# Patient Record
Sex: Male | Born: 1995 | Race: Black or African American | Hispanic: No | Marital: Single | Smoking: Current every day smoker
Health system: Southern US, Community
[De-identification: ages and names within clinical notes are randomized; demographics above are authoritative.]

## PROBLEM LIST (undated history)

## (undated) DIAGNOSIS — F419 Anxiety disorder, unspecified: Secondary | ICD-10-CM

## (undated) DIAGNOSIS — J45909 Unspecified asthma, uncomplicated: Secondary | ICD-10-CM

---
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
## 2017-08-12 ENCOUNTER — Emergency Department: Payer: Worker's Compensation

## 2017-08-12 ENCOUNTER — Encounter: Payer: Self-pay | Admitting: Emergency Medicine

## 2017-08-12 ENCOUNTER — Other Ambulatory Visit: Payer: Self-pay

## 2017-08-12 ENCOUNTER — Emergency Department
Admission: EM | Admit: 2017-08-12 | Discharge: 2017-08-12 | Disposition: A | Discharge status: Home / Self Care | Payer: Worker's Compensation | Attending: Emergency Medicine | Admitting: Emergency Medicine

## 2017-08-12 DIAGNOSIS — Z79899 Other long term (current) drug therapy: Secondary | ICD-10-CM | POA: Diagnosis not present

## 2017-08-12 DIAGNOSIS — Y999 Unspecified external cause status: Secondary | ICD-10-CM | POA: Insufficient documentation

## 2017-08-12 DIAGNOSIS — W228XXA Striking against or struck by other objects, initial encounter: Secondary | ICD-10-CM | POA: Diagnosis not present

## 2017-08-12 DIAGNOSIS — S1093XA Contusion of unspecified part of neck, initial encounter: Secondary | ICD-10-CM | POA: Diagnosis not present

## 2017-08-12 DIAGNOSIS — Y939 Activity, unspecified: Secondary | ICD-10-CM | POA: Diagnosis not present

## 2017-08-12 DIAGNOSIS — F17200 Nicotine dependence, unspecified, uncomplicated: Secondary | ICD-10-CM | POA: Diagnosis not present

## 2017-08-12 DIAGNOSIS — Y929 Unspecified place or not applicable: Secondary | ICD-10-CM | POA: Diagnosis not present

## 2017-08-12 DIAGNOSIS — S199XXA Unspecified injury of neck, initial encounter: Secondary | ICD-10-CM | POA: Diagnosis present

## 2017-08-12 HISTORY — DX: Anxiety disorder, unspecified: F41.9

## 2017-08-12 LAB — CBC WITH DIFFERENTIAL/PLATELET
BASOS ABS: 0 10*3/uL (ref 0–0.1)
BASOS PCT: 1 %
EOS ABS: 0 10*3/uL (ref 0–0.7)
EOS PCT: 1 %
HEMATOCRIT: 45.7 % (ref 40.0–52.0)
Hemoglobin: 15.6 g/dL (ref 13.0–18.0)
Lymphocytes Relative: 20 %
Lymphs Abs: 0.9 10*3/uL — ABNORMAL LOW (ref 1.0–3.6)
MCH: 30.1 pg (ref 26.0–34.0)
MCHC: 34.1 g/dL (ref 32.0–36.0)
MCV: 88.2 fL (ref 80.0–100.0)
Monocytes Absolute: 0.5 10*3/uL (ref 0.2–1.0)
Monocytes Relative: 11 %
NEUTROS ABS: 3.2 10*3/uL (ref 1.4–6.5)
Neutrophils Relative %: 67 %
PLATELETS: 177 10*3/uL (ref 150–440)
RBC: 5.18 MIL/uL (ref 4.40–5.90)
RDW: 13.4 % (ref 11.5–14.5)
WBC: 4.8 10*3/uL (ref 3.8–10.6)

## 2017-08-12 LAB — COMPREHENSIVE METABOLIC PANEL
ALT: 35 U/L (ref 17–63)
ANION GAP: 7 (ref 5–15)
AST: 36 U/L (ref 15–41)
Albumin: 4.4 g/dL (ref 3.5–5.0)
Alkaline Phosphatase: 68 U/L (ref 38–126)
BUN: 12 mg/dL (ref 6–20)
CALCIUM: 9.6 mg/dL (ref 8.9–10.3)
CO2: 26 mmol/L (ref 22–32)
Chloride: 103 mmol/L (ref 101–111)
Creatinine, Ser: 1.12 mg/dL (ref 0.61–1.24)
GFR calc Af Amer: >60 mL/min (ref >60)
GFR calc non Af Amer: >60 mL/min (ref >60)
Glucose, Bld: 100 mg/dL — ABNORMAL HIGH (ref 65–99)
Potassium: 3.5 mmol/L (ref 3.5–5.1)
SODIUM: 136 mmol/L (ref 135–145)
Total Bilirubin: 0.5 mg/dL (ref 0.3–1.2)
Total Protein: 7.6 g/dL (ref 6.5–8.1)

## 2017-08-12 MED ORDER — SODIUM CHLORIDE 0.9 % IV BOLUS
1000.0000 mL | Freq: Once | INTRAVENOUS | Status: AC
  Administered 2017-08-12: 1000 mL via INTRAVENOUS

## 2017-08-12 MED ORDER — IOPAMIDOL (ISOVUE-370) INJECTION 76%
75.0000 mL | Freq: Once | INTRAVENOUS | Status: AC | PRN
  Administered 2017-08-12: 75 mL via INTRAVENOUS
  Filled 2017-08-12: qty 75

## 2017-08-12 MED ORDER — MELOXICAM 15 MG PO TABS: 15.0000 mg | ORAL_TABLET | Freq: Every day | ORAL | 0 refills | Status: DC

## 2017-08-12 NOTE — ED Notes (Signed)
Patient transported to CT 

## 2017-08-12 NOTE — ED Triage Notes (Signed)
Pt ambulatory to triage with no difficulty. Pt reports yesterday at work a metal pole fell hitting him on the right side of his neck near his collar bone region. Pt reports reports area was dressed with steri strips and gauze at work. Now feeling at times like he is having difficulty breathing. Pt talling in full and complete sentences with no difficulty at this time.
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
## 2017-08-12 NOTE — ED Notes (Signed)
WC breath analysis completed.

## 2017-08-12 NOTE — ED Notes (Signed)
Completed WC UDS. Pt gone to xray. Will complete breath analysis in pt room.

## 2017-08-30 ENCOUNTER — Emergency Department
Admission: EM | Admit: 2017-08-30 | Discharge: 2017-08-30 | Disposition: A | Discharge status: Home / Self Care | Payer: Self-pay | Attending: Emergency Medicine | Admitting: Emergency Medicine

## 2017-08-30 ENCOUNTER — Encounter: Payer: Self-pay | Admitting: Emergency Medicine

## 2017-08-30 DIAGNOSIS — W228XXD Striking against or struck by other objects, subsequent encounter: Secondary | ICD-10-CM | POA: Insufficient documentation

## 2017-08-30 DIAGNOSIS — S1093XD Contusion of unspecified part of neck, subsequent encounter: Secondary | ICD-10-CM | POA: Insufficient documentation

## 2017-08-30 DIAGNOSIS — F419 Anxiety disorder, unspecified: Secondary | ICD-10-CM | POA: Insufficient documentation

## 2017-08-30 DIAGNOSIS — F17200 Nicotine dependence, unspecified, uncomplicated: Secondary | ICD-10-CM | POA: Insufficient documentation

## 2017-08-30 DIAGNOSIS — J45909 Unspecified asthma, uncomplicated: Secondary | ICD-10-CM | POA: Insufficient documentation

## 2017-08-30 HISTORY — DX: Unspecified asthma, uncomplicated: J45.909

## 2017-08-30 MED ORDER — MELOXICAM 15 MG PO TABS: 15.0000 mg | ORAL_TABLET | Freq: Every day | ORAL | 0 refills | Status: DC
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
## 2018-06-05 ENCOUNTER — Other Ambulatory Visit: Payer: Self-pay

## 2018-06-05 ENCOUNTER — Emergency Department
Admission: EM | Admit: 2018-06-05 | Discharge: 2018-06-05 | Disposition: A | Discharge status: Home / Self Care | Payer: Self-pay | Attending: Emergency Medicine | Admitting: Emergency Medicine

## 2018-06-05 ENCOUNTER — Encounter: Payer: Self-pay | Admitting: Emergency Medicine

## 2018-06-05 DIAGNOSIS — J069 Acute upper respiratory infection, unspecified: Secondary | ICD-10-CM | POA: Insufficient documentation

## 2018-06-05 DIAGNOSIS — J45909 Unspecified asthma, uncomplicated: Secondary | ICD-10-CM | POA: Insufficient documentation

## 2018-06-05 DIAGNOSIS — F17200 Nicotine dependence, unspecified, uncomplicated: Secondary | ICD-10-CM | POA: Insufficient documentation

## 2018-06-05 MED ORDER — PSEUDOEPH-BROMPHEN-DM 30-2-10 MG/5ML PO SYRP: 5.0000 mL | ORAL_SOLUTION | Freq: Four times a day (QID) | ORAL | 0 refills | Status: DC | PRN
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
## 2018-06-05 NOTE — Discharge Instructions (Addendum)
Begin taking Bromfed-DM as needed for cough and congestion.  Increase fluids.  Take Tylenol or ibuprofen as needed for sore throat.   Follow-up with your primary care provider if any continued problems or [facility].

## 2018-06-05 NOTE — ED Provider Notes (Signed)
[facility] Emergency Department Provider Note  ____________________________________________   First MD Initiated Contact with Patient 06/05/18 1233     (approximate)  I have reviewed the triage vital signs and the nursing notes.   HISTORY  Chief Complaint URI   HPI Kevin Bullock is a 23 y.o. male presents to the ED with complaint of upper respiratory symptoms.  Patient states that he and his girlfriend slept with the window open and now he has nasal congestion, sore throat and some coughing.  He states that this started 3 days ago.  He denies any fever.  He has not taken any over-the-counter medication.     Past Medical History:  Diagnosis Date  . Anxiety   . Asthma     There are no active problems to display for this patient.   History reviewed. No pertinent surgical history.  Prior to Admission medications   Medication Sig Start Date End Date Taking? Authorizing Provider  brompheniramine-pseudoephedrine-DM 30-2-10 MG/5ML syrup Take 5 mLs by mouth 4 (four) times daily as needed. 06/05/18   Tommi Rumps, PA-C    Allergies Patient has no known allergies.  History reviewed. No pertinent family history.  Social History Social History   Tobacco Use  . Smoking status: Current Every Day Smoker    Packs/day: 0.50  . Smokeless tobacco: Never Used  Substance Use Topics  . Alcohol use: No  . Drug use: Never    Review of Systems Constitutional: No fever/chills Eyes: No visual changes. ENT: Positive sore throat.  Positive nasal congestion. Cardiovascular: Denies chest pain. Respiratory: Denies shortness of breath.  Positive cough. Gastrointestinal: No abdominal pain.  No nausea, no vomiting. Musculoskeletal: Negative for muscle aches. Skin: Negative for rash. Neurological: Negative for headaches, focal weakness or numbness. ___________________________________________   PHYSICAL EXAM:  VITAL SIGNS: ED Triage Vitals  Enc Vitals  Group     BP 06/05/18 1130 132/65     Pulse Rate 06/05/18 1130 97     Resp 06/05/18 1130 16     Temp 06/05/18 1130 98.4 F (36.9 C)     Temp Source 06/05/18 1130 Oral     SpO2 06/05/18 1130 96 %     Weight 06/05/18 1129 170 lb (77.1 kg)     Height 06/05/18 1129 5\' 8"  (1.727 m)     Head Circumference --      Peak Flow --      Pain Score 06/05/18 1129 0     Pain Loc --      Pain Edu? --      Excl. in GC? --    Constitutional: Alert and oriented. Well appearing and in no acute distress. Eyes: Conjunctivae are normal.  Head: Atraumatic. Nose: Positive congestion/rhinnorhea.  TMs are dull bilaterally. Mouth/Throat: Mucous membranes are moist.  Oropharynx non-erythematous. Neck: No stridor.   Hematological/Lymphatic/Immunilogical: No cervical lymphadenopathy. Cardiovascular: Normal rate, regular rhythm. Grossly normal heart sounds.  Good peripheral circulation. Respiratory: Normal respiratory effort.  No retractions. Lungs CTAB. Musculoskeletal: Moves upper and lower extremities without any difficulty.  Normal gait was noted. Neurologic:  Normal speech and language. No gross focal neurologic deficits are appreciated. No gait instability. Skin:  Skin is warm, dry and intact. No rash noted. Psychiatric: Mood and affect are normal. Speech and behavior are normal.  ____________________________________________   LABS (all labs ordered are listed, but only abnormal results are displayed)  Labs Reviewed - No data to display ____________________________________________  PROCEDURES  Procedure(s) performed (including Critical Care):  Procedures   ____________________________________________   INITIAL IMPRESSION / ASSESSMENT AND PLAN / ED COURSE  As part of my medical decision making, I reviewed the following data within the electronic MEDICAL RECORD NUMBER Notes from prior ED visits and [place] Controlled Substance Database  Patient presents to the ED with complaint of 3 days URI symptoms  after he slept with the window open.  He states that he has not taken any over-the-counter medication for his symptoms of congestion and sore throat.  He denies any fever or chills.  There is been no nausea or vomiting.  He requested a note to take the entire week off.  It was explained to him that the hospital policy is for a note for 2 days.  Patient was given a prescription for Bromfed-DM as needed for his symptoms. ____________________________________________   FINAL CLINICAL IMPRESSION(S) / ED DIAGNOSES  Final diagnoses:  Viral upper respiratory tract infection     ED Discharge Orders         Ordered    brompheniramine-pseudoephedrine-DM 30-2-10 MG/5ML syrup  4 times daily PRN     06/05/18 1336           Note:  This document was prepared using Dragon voice recognition software and may include unintentional dictation errors.    Tommi Rumps, PA-C 06/05/18 1423    Jene Every, MD 06/05/18 [phone]

## 2018-09-11 ENCOUNTER — Other Ambulatory Visit: Payer: Self-pay

## 2018-09-11 ENCOUNTER — Encounter: Payer: Self-pay | Admitting: *Deleted

## 2018-09-11 ENCOUNTER — Emergency Department
Admission: EM | Admit: 2018-09-11 | Discharge: 2018-09-11 | Disposition: A | Discharge status: Home / Self Care | Payer: Self-pay | Attending: Emergency Medicine | Admitting: Emergency Medicine

## 2018-09-11 DIAGNOSIS — F17200 Nicotine dependence, unspecified, uncomplicated: Secondary | ICD-10-CM | POA: Insufficient documentation

## 2018-09-11 DIAGNOSIS — J029 Acute pharyngitis, unspecified: Secondary | ICD-10-CM | POA: Insufficient documentation

## 2018-09-11 DIAGNOSIS — H1089 Other conjunctivitis: Secondary | ICD-10-CM | POA: Insufficient documentation

## 2018-09-11 DIAGNOSIS — H1032 Unspecified acute conjunctivitis, left eye: Secondary | ICD-10-CM

## 2018-09-11 DIAGNOSIS — J45909 Unspecified asthma, uncomplicated: Secondary | ICD-10-CM | POA: Insufficient documentation

## 2018-09-11 LAB — GROUP A STREP BY PCR: Group A Strep by PCR: NOT DETECTED

## 2018-09-11 MED ORDER — ERYTHROMYCIN 5 MG/GM OP OINT: 1.0000 "application " | TOPICAL_OINTMENT | Freq: Every day | OPHTHALMIC | 0 refills | Status: AC

## 2018-09-11 NOTE — ED Triage Notes (Signed)
Pt has a sore throat and left eye drainage.  No known injury to eye.  Pt alert.  Sx for 3 days

## 2018-09-11 NOTE — ED Provider Notes (Signed)
[facility] Emergency Department Provider Note  ____________________________________________  Time seen: Approximately 9:12 PM  I have reviewed the triage vital signs and the nursing notes.   HISTORY  Chief Complaint Sore Throat and Conjunctivitis    HPI Kevin Bullock is a 23 y.o. male presents to the emergency department with pharyngitis and left eye conjunctivitis for the past 3 to 4 days.  Patient denies headache, rhinorrhea, nasal congestion or nonproductive cough.  Patient denies tactile fever at home.  He has had no difficulty swallowing and is tolerating his own secretions in exam room.  Denies unprotected sex. No other alleviating measures have been attempted.         Past Medical History:  Diagnosis Date  . Anxiety   . Asthma     There are no active problems to display for this patient.   No past surgical history on file.  Prior to Admission medications   Medication Sig Start Date End Date Taking? Authorizing Provider  brompheniramine-pseudoephedrine-DM 30-2-10 MG/5ML syrup Take 5 mLs by mouth 4 (four) times daily as needed. 06/05/18   Tommi RumpsSummers, Rhonda L, PA-C  erythromycin ophthalmic ointment Place 1 application into the left eye at bedtime for 7 days. 09/11/18 09/18/18  Orvil FeilWoods, Oveda Dadamo M, PA-C    Allergies Patient has no known allergies.  No family history on file.  Social History Social History   Tobacco Use  . Smoking status: Current Every Day Smoker    Packs/day: 0.50  . Smokeless tobacco: Never Used  Substance Use Topics  . Alcohol use: No  . Drug use: Never     Review of Systems  Constitutional: No fever/chills Eyes: No visual changes. Patient has left eye conjunctivitis.  ENT: Patient has pharyngitis.  Cardiovascular: no chest pain. Respiratory: no cough. No SOB. Gastrointestinal: No abdominal pain.  No nausea, no vomiting.  No diarrhea.  No constipation. Genitourinary: Negative for dysuria. No  hematuria Musculoskeletal: Negative for musculoskeletal pain. Skin: Negative for rash, abrasions, lacerations, ecchymosis. Neurological: Negative for headaches, focal weakness or numbness.  ____________________________________________   PHYSICAL EXAM:  VITAL SIGNS: ED Triage Vitals  Enc Vitals Group     BP 09/11/18 1805 127/75     Pulse Rate 09/11/18 1805 80     Resp 09/11/18 1805 (!) 180     Temp 09/11/18 1805 98.8 F (37.1 C)     Temp Source 09/11/18 1805 Oral     SpO2 09/11/18 1805 99 %     Weight 09/11/18 1804 180 lb (81.6 kg)     Height 09/11/18 1804 5\' 8"  (1.727 m)     Head Circumference --      Peak Flow --      Pain Score 09/11/18 1803 3     Pain Loc --      Pain Edu? --      Excl. in [place]? --      Constitutional: Alert and oriented. Well appearing and in no acute distress. Eyes: Patient has left eye conjunctivitis.  PERRL. EOMI. Head: Atraumatic. ENT:      Ears: TMs are injected bilaterally.      Nose: No congestion/rhinnorhea.      Mouth/Throat: Mucous membranes are moist.  Posterior pharynx is erythematous with tonsillar exudate. Neck: No stridor.  No cervical spine tenderness to palpation. Hematological/Lymphatic/Immunilogical: Palpable cervical lymphadenopathy. Cardiovascular: Normal rate, regular rhythm. Normal S1 and S2.  Good peripheral circulation. Respiratory: Normal respiratory effort without tachypnea or retractions. Lungs CTAB. Good air entry to the bases with  no decreased or absent breath sounds. Gastrointestinal: Bowel sounds 4 quadrants. Soft and nontender to palpation. No guarding or rigidity. No palpable masses. No distention. No CVA tenderness. Skin:  Skin is warm, dry and intact. No rash noted. Psychiatric: Mood and affect are normal. Speech and behavior are normal. Patient exhibits appropriate insight and judgement.   ____________________________________________   LABS (all labs ordered are listed, but only abnormal results are  displayed)  Labs Reviewed  GROUP A STREP BY PCR   ____________________________________________  EKG   ____________________________________________  RADIOLOGY    No results found.  ____________________________________________    PROCEDURES  Procedure(s) performed:    Procedures    Medications - No data to display   ____________________________________________   INITIAL IMPRESSION / ASSESSMENT AND PLAN / ED COURSE  Pertinent labs & imaging results that were available during my care of the patient were reviewed by me and considered in my medical decision making (see chart for details).  Review of the [place] CSRS was performed in accordance of the [place] prior to dispensing any controlled drugs.           Assessment and Plan:  Viral pharyngitis Bacterial conjunctivitis 23 year old male presents to the emergency department with pharyngitis for the past 3 to 4 days and left eye conjunctivitis.  Differential diagnosis included group A strep pharyngitis, bacterial conjunctivitis, viral pharyngitis, unspecified viral URI... Patient tested negative for group A strep in the emergency department.  Patient was discharged with erythromycin ointment for left eye conjunctivitis.  Recommended Tylenol and ibuprofen alternating for pharyngitis.  Work note was provided upon patient's request.  All patient questions were answered.  ____________________________________________  FINAL CLINICAL IMPRESSION(S) / ED DIAGNOSES  Final diagnoses:  Pharyngitis, unspecified etiology  Acute bacterial conjunctivitis of left eye      NEW MEDICATIONS STARTED DURING THIS VISIT:  ED Discharge Orders         Ordered    erythromycin ophthalmic ointment  Daily at bedtime     09/11/18 2149              This chart was dictated using voice recognition software/Dragon. Despite best efforts to proofread, errors can occur which can change the meaning. Any change was purely  unintentional.    Lannie Fields, PA-C 09/11/18 2315    Nena Polio, MD 09/11/18 [phone]

## 2018-10-10 ENCOUNTER — Ambulatory Visit: Payer: Self-pay

## 2018-10-16 ENCOUNTER — Other Ambulatory Visit: Payer: Self-pay

## 2018-10-16 ENCOUNTER — Encounter: Payer: Self-pay | Admitting: Physician Assistant

## 2018-10-16 ENCOUNTER — Ambulatory Visit: Payer: Self-pay | Admitting: Physician Assistant

## 2018-10-16 DIAGNOSIS — A5401 Gonococcal cystitis and urethritis, unspecified: Secondary | ICD-10-CM

## 2018-10-16 DIAGNOSIS — Z113 Encounter for screening for infections with a predominantly sexual mode of transmission: Secondary | ICD-10-CM

## 2018-10-16 LAB — GRAM STAIN

## 2018-10-16 MED ORDER — AZITHROMYCIN 500 MG PO TABS: ORAL_TABLET | ORAL | 0 refills | Status: DC

## 2018-10-16 MED ORDER — CEFTRIAXONE SODIUM 250 MG IJ SOLR
250.0000 mg | Freq: Once | INTRAMUSCULAR | Status: AC
  Administered 2018-10-16: 250 mg via INTRAMUSCULAR

## 2018-10-16 NOTE — Progress Notes (Signed)
    STI clinic/screening visit  Subjective:  Kevin Bullock is a 23 y.o. male being seen today for an STI screening visit. The patient reports they do have symptoms.  Patient has the following medical conditionsdoes not have a problem list on file.  Chief Complaint  Patient presents with  . SEXUALLY TRANSMITTED DISEASE    Patient reports that he has been having yellow penile discharge and dysuria for 1-1 1/2 weeks.  Denies other symptoms and concerns today.  See flowsheet for further details and programmatic requirements.    The following portions of the patient's history were reviewed and updated as appropriate: allergies, current medications, past family history, past medical history, past social history, past surgical history and problem list. Problem list updated.  Objective:  There were no vitals filed for this visit.  Physical Exam Constitutional:      General: He is not in acute distress.    Appearance: Normal appearance.  HENT:     Head: Normocephalic and atraumatic.     Mouth/Throat:     Mouth: Mucous membranes are moist.     Pharynx: Oropharynx is clear. No oropharyngeal exudate or posterior oropharyngeal erythema.  Neck:     Musculoskeletal: Neck supple.  Pulmonary:     Effort: Pulmonary effort is normal.  Abdominal:     Palpations: Abdomen is soft. There is no mass.     Tenderness: There is no abdominal tenderness. There is no guarding or rebound.  Genitourinary:    Scrotum/Testes: Normal.     Comments: Pubic area without nits, lice, edema, erythema,lesions and inguinal adenopathy. Penis without edema, erythema, lesions, small amount of yellowish penile discharge at meatus  Lymphadenopathy:     Cervical: No cervical adenopathy.  Skin:    General: Skin is warm and dry.     Findings: No bruising, erythema, lesion or rash.  Neurological:     Mental Status: He is alert and oriented to person, place, and time.  Psychiatric:        Mood and Affect: Mood  normal.        Behavior: Behavior normal.        Thought Content: Thought content normal.        Judgment: Judgment normal.       Assessment and Plan:  Kevin Bullock is a 23 y.o. male presenting to the [facility] Department for STI screening  1. Screening for STD (sexually transmitted disease) Patient with discharge and dysuria Rec condoms with all sex Reviewed Gram stain results and will treat for GC.  Await test results.  Counseled that RN will call if he needs to RTC for any further treatment. - Gram stain - HIV [facility] - Syphilis Serology, [facility] - azithromycin (ZITHROMAX) 500 MG tablet; Take po DOT x 1  Dispense: 2 tablet; Refill: 0 - cefTRIAXone (ROCEPHIN) injection 250 mg  2. Gonococcal urethritis in male Will treat for GC with Ceftriaxone 250mg  IM and Azithromycin 1g po DOT today. No sex for 7 days and until after partner has completed treatment RTC if vomits < 2 hr after taking meds to arrange for retreatment. - azithromycin (ZITHROMAX) 500 MG tablet; Take po DOT x 1  Dispense: 2 tablet; Refill: 0 - cefTRIAXone (ROCEPHIN) injection 250 mg     No follow-ups on file.  No future appointments.  Jerene Dilling, PA

## 2018-11-08 ENCOUNTER — Other Ambulatory Visit: Payer: Self-pay

## 2018-11-08 DIAGNOSIS — Z20822 Contact with and (suspected) exposure to covid-19: Secondary | ICD-10-CM

## 2018-11-09 LAB — NOVEL CORONAVIRUS, NAA: SARS-CoV-2, NAA: NOT DETECTED

## 2019-07-25 ENCOUNTER — Encounter: Payer: Self-pay | Admitting: Emergency Medicine

## 2019-07-25 ENCOUNTER — Emergency Department
Admission: EM | Admit: 2019-07-25 | Discharge: 2019-07-25 | Disposition: A | Discharge status: Home / Self Care | Payer: Self-pay | Attending: Emergency Medicine | Admitting: Emergency Medicine

## 2019-07-25 ENCOUNTER — Other Ambulatory Visit: Payer: Self-pay

## 2019-07-25 DIAGNOSIS — F17290 Nicotine dependence, other tobacco product, uncomplicated: Secondary | ICD-10-CM | POA: Insufficient documentation

## 2019-07-25 DIAGNOSIS — F419 Anxiety disorder, unspecified: Secondary | ICD-10-CM | POA: Insufficient documentation

## 2019-07-25 DIAGNOSIS — J0100 Acute maxillary sinusitis, unspecified: Secondary | ICD-10-CM | POA: Insufficient documentation

## 2019-07-25 DIAGNOSIS — J45909 Unspecified asthma, uncomplicated: Secondary | ICD-10-CM | POA: Insufficient documentation

## 2019-07-25 MED ORDER — HYDROXYZINE HCL 50 MG PO TABS: 50.0000 mg | ORAL_TABLET | Freq: Three times a day (TID) | ORAL | 0 refills | Status: DC | PRN

## 2019-07-25 MED ORDER — AMOXICILLIN 500 MG PO CAPS: 500.0000 mg | ORAL_CAPSULE | Freq: Three times a day (TID) | ORAL | 0 refills | Status: DC

## 2019-07-25 MED ORDER — FEXOFENADINE-PSEUDOEPHED ER 60-120 MG PO TB12: 1.0000 | ORAL_TABLET | Freq: Two times a day (BID) | ORAL | 0 refills | Status: DC

## 2019-07-25 NOTE — ED Triage Notes (Signed)
Pt reports upper resp symptoms for the past week or more. Pt denies sore throat or fever and reports had a COVID test that was negative.

## 2019-07-25 NOTE — Discharge Instructions (Signed)
Follow discharge care instruction take medication as directed.  You are given a 10-day supply of anxiety medication.  During this timeframe you advised to establish care with [facility].  Tell them you are a follow-up from the emergency room.

## 2019-07-25 NOTE — ED Provider Notes (Signed)
[facility] Emergency Department Provider Note   ____________________________________________   First MD Initiated Contact with Patient 07/25/19 [phone]     (approximate)  I have reviewed the triage vital signs and the nursing notes.   HISTORY  Chief Complaint Nasal Congestion, Facial Pain, Cough, and URI    HPI Kevin Bullock is a 24 y.o. male patient presents with 2 weeks of nasal congestion postnasal drainage.  Patient also has history of anxiety.  Patient states lack of insurance is sending him for seeking care for anxiety.  Patient denies fever chills associated with complaint.  Patient denies recent travel or known contact with COVID-19.  Patient has tested twice negative for COVID-19.         Past Medical History:  Diagnosis Date  . Anxiety   . Asthma     There are no problems to display for this patient.   History reviewed. No pertinent surgical history.  Prior to Admission medications   Medication Sig Start Date End Date Taking? Authorizing Provider  amoxicillin (AMOXIL) 500 MG capsule Take 1 capsule (500 mg total) by mouth 3 (three) times daily. 07/25/19   Sable Feil, PA-C  azithromycin (ZITHROMAX) 500 MG tablet Take po DOT x 1 10/16/18   Jerene Dilling, [place]  brompheniramine-pseudoephedrine-DM 30-2-10 MG/5ML syrup Take 5 mLs by mouth 4 (four) times daily as needed. 06/05/18   Johnn Hai, PA-C  fexofenadine-pseudoephedrine (ALLEGRA-D) 60-120 MG 12 hr tablet Take 1 tablet by mouth 2 (two) times daily. 07/25/19   Sable Feil, PA-C  hydrOXYzine (ATARAX/VISTARIL) 50 MG tablet Take 1 tablet (50 mg total) by mouth 3 (three) times daily as needed. 07/25/19   Sable Feil, PA-C    Allergies Patient has no known allergies.  No family history on file.  Social History Social History   Tobacco Use  . Smoking status: Current Every Day Smoker    Packs/day: 0.50    Types: Cigars  . Smokeless tobacco: Never Used  Substance Use  Topics  . Alcohol use: No  . Drug use: Never    Review of Systems Constitutional: No fever/chills Eyes: No visual changes. ENT: Facial pain and nasal congestion.   Cardiovascular: Denies chest pain. Respiratory: Denies shortness of breath. Gastrointestinal: No abdominal pain.  No nausea, no vomiting.  No diarrhea.  No constipation. Genitourinary: Negative for dysuria. Musculoskeletal: Negative for back pain. Skin: Negative for rash. Neurological: Negative for headaches, focal weakness or numbness. Psychiatric:  Anxiety ____________________________________________   PHYSICAL EXAM:  VITAL SIGNS: ED Triage Vitals  Enc Vitals Group     BP 07/25/19 0736 140/66     Pulse Rate 07/25/19 0736 78     Resp 07/25/19 0736 16     Temp 07/25/19 0736 98 F (36.7 C)     Temp Source 07/25/19 0736 Oral     SpO2 07/25/19 0736 99 %     Weight 07/25/19 0732 180 lb (81.6 kg)     Height 07/25/19 0732 5\' 8"  (1.727 m)     Head Circumference --      Peak Flow --      Pain Score 07/25/19 0732 8     Pain Loc --      Pain Edu? --      Excl. in [place]? --     Constitutional: Alert and oriented. Well appearing and in no acute distress. Eyes: Conjunctivae are normal. PERRL. EOMI. Head: Atraumatic. Nose: Bilateral maxillary guarding with edematous nasal turbinates. Mouth/Throat: Mucous  membranes are moist.  Oropharynx non-erythematous.  Postnasal drainage. Neck: No stridor.   Hematological/Lymphatic/Immunilogical: No cervical lymphadenopathy. Cardiovascular: Normal rate, regular rhythm. Grossly normal heart sounds.  Good peripheral circulation. Respiratory: Normal respiratory effort.  No retractions. Lungs CTAB. Neurologic:  Normal speech and language. No gross focal neurologic deficits are appreciated. No gait instability. Skin:  Skin is warm, dry and intact. No rash noted. Psychiatric: Mood and affect are normal. Speech and behavior are normal.  ____________________________________________     LABS (all labs ordered are listed, but only abnormal results are displayed)  Labs Reviewed - No data to display ____________________________________________  EKG   ____________________________________________  RADIOLOGY  ED MD interpretation:    Official radiology report(s): No results found.  ____________________________________________   PROCEDURES  Procedure(s) performed (including Critical Care):  Procedures   ____________________________________________   INITIAL IMPRESSION / ASSESSMENT AND PLAN / ED COURSE  As part of my medical decision making, I reviewed the following data within the electronic MEDICAL RECORD NUMBER     Patient presents with nasal congestion consistent with subacute maxillary sinusitis.  Patient also has a history of anxiety.  Patient given discharge care instruction.  Patient given a prescription for amoxicillin, Allegra-D, and a 10-day supply of Atarax.  Patient advised [facility] care with [facility].    Kevin Bullock was evaluated in Emergency Department on 07/25/2019 for the symptoms described in the history of present illness. He was evaluated in the context of the global COVID-19 pandemic, which necessitated consideration that the patient might be at risk for infection with the SARS-CoV-2 virus that causes COVID-19. Institutional protocols and algorithms that pertain to the evaluation of patients at risk for COVID-19 are in a state of rapid change based on information released by regulatory bodies including the CDC and federal and state organizations. These policies and algorithms were followed during the patient's care in the ED.       ____________________________________________   FINAL CLINICAL IMPRESSION(S) / ED DIAGNOSES  Final diagnoses:  Subacute maxillary sinusitis  Anxiety     ED Discharge Orders         Ordered    hydrOXYzine (ATARAX/VISTARIL) 50 MG tablet  3 times daily PRN     07/25/19 0750     fexofenadine-pseudoephedrine (ALLEGRA-D) 60-120 MG 12 hr tablet  2 times daily     07/25/19 0750    amoxicillin (AMOXIL) 500 MG capsule  3 times daily     07/25/19 0750           Note:  This document was prepared using Dragon voice recognition software and may include unintentional dictation errors.    Joni Reining, PA-C 07/25/19 [id number]    Chesley Noon, MD 07/26/19 [phone]

## 2019-07-28 ENCOUNTER — Emergency Department: Payer: Self-pay

## 2019-07-28 ENCOUNTER — Emergency Department
Admission: EM | Admit: 2019-07-28 | Discharge: 2019-07-28 | Disposition: A | Discharge status: Home / Self Care | Payer: Self-pay | Attending: Emergency Medicine | Admitting: Emergency Medicine

## 2019-07-28 ENCOUNTER — Encounter: Payer: Self-pay | Admitting: Emergency Medicine

## 2019-07-28 ENCOUNTER — Other Ambulatory Visit: Payer: Self-pay

## 2019-07-28 DIAGNOSIS — M7918 Myalgia, other site: Secondary | ICD-10-CM | POA: Insufficient documentation

## 2019-07-28 DIAGNOSIS — R112 Nausea with vomiting, unspecified: Secondary | ICD-10-CM | POA: Insufficient documentation

## 2019-07-28 DIAGNOSIS — R05 Cough: Secondary | ICD-10-CM | POA: Insufficient documentation

## 2019-07-28 DIAGNOSIS — J45909 Unspecified asthma, uncomplicated: Secondary | ICD-10-CM | POA: Insufficient documentation

## 2019-07-28 DIAGNOSIS — J069 Acute upper respiratory infection, unspecified: Secondary | ICD-10-CM | POA: Insufficient documentation

## 2019-07-28 DIAGNOSIS — R509 Fever, unspecified: Secondary | ICD-10-CM

## 2019-07-28 DIAGNOSIS — F17290 Nicotine dependence, other tobacco product, uncomplicated: Secondary | ICD-10-CM | POA: Insufficient documentation

## 2019-07-28 DIAGNOSIS — R197 Diarrhea, unspecified: Secondary | ICD-10-CM | POA: Insufficient documentation

## 2019-07-28 DIAGNOSIS — R0981 Nasal congestion: Secondary | ICD-10-CM | POA: Insufficient documentation

## 2019-07-28 DIAGNOSIS — Z20822 Contact with and (suspected) exposure to covid-19: Secondary | ICD-10-CM | POA: Insufficient documentation

## 2019-07-28 LAB — CBC WITH DIFFERENTIAL/PLATELET
Abs Immature Granulocytes: 0.02 10*3/uL (ref 0.00–0.07)
Basophils Absolute: 0 10*3/uL (ref 0.0–0.1)
Basophils Relative: 0 %
Eosinophils Absolute: 0 10*3/uL (ref 0.0–0.5)
Eosinophils Relative: 0 %
HCT: 48.3 % (ref 39.0–52.0)
Hemoglobin: 16.7 g/dL (ref 13.0–17.0)
Immature Granulocytes: 0 %
Lymphocytes Relative: 4 %
Lymphs Abs: 0.3 10*3/uL — ABNORMAL LOW (ref 0.7–4.0)
MCH: 29.9 pg (ref 26.0–34.0)
MCHC: 34.6 g/dL (ref 30.0–36.0)
MCV: 86.4 fL (ref 80.0–100.0)
Monocytes Absolute: 0.3 10*3/uL (ref 0.1–1.0)
Monocytes Relative: 4 %
Neutro Abs: 8 10*3/uL — ABNORMAL HIGH (ref 1.7–7.7)
Neutrophils Relative %: 92 %
Platelets: 195 10*3/uL (ref 150–400)
RBC: 5.59 MIL/uL (ref 4.22–5.81)
RDW: 12.4 % (ref 11.5–15.5)
WBC: 8.6 10*3/uL (ref 4.0–10.5)
nRBC: 0 % (ref 0.0–0.2)

## 2019-07-28 LAB — COMPREHENSIVE METABOLIC PANEL
ALT: 65 U/L — ABNORMAL HIGH (ref 0–44)
AST: 46 U/L — ABNORMAL HIGH (ref 15–41)
Albumin: 4.4 g/dL (ref 3.5–5.0)
Alkaline Phosphatase: 80 U/L (ref 38–126)
Anion gap: 10 (ref 5–15)
BUN: 14 mg/dL (ref 6–20)
CO2: 23 mmol/L (ref 22–32)
Calcium: 9.6 mg/dL (ref 8.9–10.3)
Chloride: 104 mmol/L (ref 98–111)
Creatinine, Ser: 1.04 mg/dL (ref 0.61–1.24)
GFR calc Af Amer: >60 mL/min (ref >60)
GFR calc non Af Amer: >60 mL/min (ref >60)
Glucose, Bld: 130 mg/dL — ABNORMAL HIGH (ref 70–99)
Potassium: 4.1 mmol/L (ref 3.5–5.1)
Sodium: 137 mmol/L (ref 135–145)
Total Bilirubin: 0.6 mg/dL (ref 0.3–1.2)
Total Protein: 8 g/dL (ref 6.5–8.1)

## 2019-07-28 LAB — LIPASE, BLOOD: Lipase: 33 U/L (ref 11–51)

## 2019-07-28 LAB — RESPIRATORY PANEL BY RT PCR (FLU A&B, COVID)
Influenza A by PCR: NEGATIVE
Influenza B by PCR: NEGATIVE
SARS Coronavirus 2 by RT PCR: NEGATIVE

## 2019-07-28 LAB — GROUP A STREP BY PCR: Group A Strep by PCR: NOT DETECTED

## 2019-07-28 MED ORDER — IOHEXOL 300 MG/ML  SOLN
100.0000 mL | Freq: Once | INTRAMUSCULAR | Status: AC | PRN
  Administered 2019-07-28: 14:00:00 100 mL via INTRAVENOUS
  Filled 2019-07-28: qty 100

## 2019-07-28 MED ORDER — CEFTRIAXONE SODIUM 1 G IJ SOLR: 1.0000 g | Freq: Once | INTRAMUSCULAR | Status: DC

## 2019-07-28 MED ORDER — SODIUM CHLORIDE 0.9 % IV BOLUS
500.0000 mL | Freq: Once | INTRAVENOUS | Status: AC
  Administered 2019-07-28: 500 mL via INTRAVENOUS

## 2019-07-28 MED ORDER — AZITHROMYCIN 250 MG PO TABS
ORAL_TABLET | ORAL | 0 refills | Status: DC
Start: 2019-07-28 — End: 2019-12-10

## 2019-07-28 MED ORDER — SODIUM CHLORIDE 0.9 % IV SOLN
1.0000 g | Freq: Once | INTRAVENOUS | Status: AC
  Administered 2019-07-28: 16:00:00 1 g via INTRAVENOUS
  Filled 2019-07-28: qty 10

## 2019-07-28 MED ORDER — AZITHROMYCIN 500 MG PO TABS
500.0000 mg | ORAL_TABLET | Freq: Once | ORAL | Status: AC
  Administered 2019-07-28: 16:00:00 500 mg via ORAL
  Filled 2019-07-28: qty 1

## 2019-07-28 MED ORDER — ACETAMINOPHEN 325 MG PO TABS
650.0000 mg | ORAL_TABLET | Freq: Once | ORAL | Status: AC | PRN
  Administered 2019-07-28: 650 mg via ORAL
  Filled 2019-07-28: qty 2

## 2019-07-28 MED ORDER — ONDANSETRON HCL 4 MG PO TABS: 4.0000 mg | ORAL_TABLET | Freq: Every day | ORAL | 0 refills | Status: AC | PRN

## 2019-07-28 MED ORDER — ONDANSETRON HCL 4 MG/2ML IJ SOLN
4.0000 mg | Freq: Once | INTRAMUSCULAR | Status: AC
  Administered 2019-07-28: 4 mg via INTRAVENOUS
  Filled 2019-07-28: qty 2

## 2019-07-28 NOTE — ED Triage Notes (Signed)
Pt here for generalized body aches, temp 100.5 at home, sore throat and headache. Denies exposure to illness that he knows of.  NAD. Unlabored

## 2019-07-28 NOTE — ED Provider Notes (Signed)
[facility] Emergency Department Provider Note  ____________________________________________  Time seen: Approximately 11:15 AM  I have reviewed the triage vital signs and the nursing notes.   HISTORY  Chief Complaint Fever and Generalized Body Aches    HPI Kevin Bullock is a 24 y.o. male that presents to the emergency department for evaluation of fever, vomiting, diarrhea, body aches this morning, nasal congestion for several days, and cough for 2 weeks.  Patient estimates having 3 episodes of vomiting and diarrhea since this morning.  His he has nasal congestion for several days and thought he had a sinus infection.  Patient would state that his cough is his worst symptom.  He has had a nonproductive cough for 2 weeks.  Patient smokes daily.  He was treated 3 days ago for a sinus infection with amoxicillin.  No sick contacts.  He was given Tylenol when he arrived in the emergency department but has vomited since.  No shortness of breath, chest pain, abdominal pain. No urinary symptoms.  Past Medical History:  Diagnosis Date  . Anxiety   . Asthma     There are no problems to display for this patient.   History reviewed. No pertinent surgical history.  Prior to Admission medications   Medication Sig Start Date End Date Taking? Authorizing Provider  amoxicillin (AMOXIL) 500 MG capsule Take 1 capsule (500 mg total) by mouth 3 (three) times daily. 07/25/19   Joni Reining, PA-C  azithromycin (ZITHROMAX Z-PAK) 250 MG tablet Take 2 tablets (500 mg) on  Day 1,  followed by 1 tablet (250 mg) once daily on Days 2 through 5. 07/28/19   Enid Derry, PA-C  brompheniramine-pseudoephedrine-DM 30-2-10 MG/5ML syrup Take 5 mLs by mouth 4 (four) times daily as needed. 06/05/18   Tommi Rumps, PA-C  fexofenadine-pseudoephedrine (ALLEGRA-D) 60-120 MG 12 hr tablet Take 1 tablet by mouth 2 (two) times daily. 07/25/19   Joni Reining, PA-C  hydrOXYzine (ATARAX/VISTARIL)  50 MG tablet Take 1 tablet (50 mg total) by mouth 3 (three) times daily as needed. 07/25/19   Joni Reining, PA-C  ondansetron [facility]) 4 MG tablet Take 1 tablet (4 mg total) by mouth daily as needed for nausea or vomiting. 07/28/19 07/27/20  Enid Derry, PA-C    Allergies Patient has no known allergies.  History reviewed. No pertinent family history.  Social History Social History   Tobacco Use  . Smoking status: Current Every Day Smoker    Packs/day: 0.50    Types: Cigars  . Smokeless tobacco: Never Used  Substance Use Topics  . Alcohol use: No  . Drug use: Never     Review of Systems  Constitutional: Positive for fever Eyes: No visual changes. No discharge. ENT: Positive for congestion and rhinorrhea. Cardiovascular: No chest pain. Respiratory: Positive for cough. No SOB. Gastrointestinal: No abdominal pain.  Positive for vomiting and diarrhea. Musculoskeletal: Positive for body aches. Skin: Negative for rash, abrasions, lacerations, ecchymosis. Neurological: Negative for headaches.   ____________________________________________   PHYSICAL EXAM:  VITAL SIGNS: ED Triage Vitals  Enc Vitals Group     BP 07/28/19 0919 136/69     Pulse Rate 07/28/19 0919 (!) 106     Resp 07/28/19 0919 18     Temp 07/28/19 0919 (!) 101 F (38.3 C)     Temp Source 07/28/19 0919 Oral     SpO2 07/28/19 0919 96 %     Weight 07/28/19 0911 180 lb (81.6 kg)  Height 07/28/19 0911 5\' 8"  (1.727 m)     Head Circumference --      Peak Flow --      Pain Score 07/28/19 0911 9     Pain Loc --      Pain Edu? --      Excl. in GC? --      Constitutional: Alert and oriented. Well appearing and in no acute distress. Eyes: Conjunctivae are normal. PERRL. EOMI. No discharge. Head: Atraumatic. ENT: No frontal and maxillary sinus tenderness.      Ears: Tympanic membranes pink. No discharge.      Nose: Moderate congestion/rhinnorhea.      Mouth/Throat: Mucous membranes are moist.  Oropharynx erythematous. Tonsils not enlarged. No exudates. Uvula midline. Neck: No stridor.   Hematological/Lymphatic/Immunilogical: No cervical lymphadenopathy. Cardiovascular: Normal rate, regular rhythm.  Good peripheral circulation. Respiratory: Normal respiratory effort without tachypnea or retractions. Lungs CTAB. Good air entry to the bases with no decreased or absent breath sounds. Gastrointestinal: Bowel sounds 4 quadrants. Soft and nontender to palpation. No guarding or rigidity. No palpable masses. No distention. Musculoskeletal: Full range of motion to all extremities. No gross deformities appreciated. Neurologic:  Normal speech and language. No gross focal neurologic deficits are appreciated.  Skin:  Skin is warm, dry and intact. No rash noted. Psychiatric: Mood and affect are normal. Speech and behavior are normal. Patient exhibits appropriate insight and judgement.   ____________________________________________   LABS (all labs ordered are listed, but only abnormal results are displayed)  Labs Reviewed  CBC WITH DIFFERENTIAL/PLATELET - Abnormal; Notable for the following components:      Result Value   Neutro Abs 8.0 (*)    Lymphs Abs 0.3 (*)    All other components within normal limits  COMPREHENSIVE METABOLIC PANEL - Abnormal; Notable for the following components:   Glucose, Bld 130 (*)    AST 46 (*)    ALT 65 (*)    All other components within normal limits  GROUP A STREP BY PCR  RESPIRATORY PANEL BY RT PCR (FLU A&B, COVID)  LIPASE, BLOOD  URINALYSIS, COMPLETE (UACMP) WITH MICROSCOPIC   ____________________________________________  EKG   ____________________________________________  RADIOLOGY 07/30/19, personally viewed and evaluated these images (plain radiographs) as part of my medical decision making, as well as reviewing the written report by the radiologist.  CT ABDOMEN PELVIS W CONTRAST  Result Date: 07/28/2019 CLINICAL DATA:  Diarrhea,  nausea and vomiting, fever for 3 days, generalized body aches EXAM: CT ABDOMEN AND PELVIS WITH CONTRAST TECHNIQUE: Multidetector CT imaging of the abdomen and pelvis was performed using the standard protocol following bolus administration of intravenous contrast. CONTRAST:  07/30/2019 OMNIPAQUE IOHEXOL 300 MG/ML  SOLN COMPARISON:  06/19/2007 FINDINGS: Lower chest: No acute pleural or parenchymal lung disease. Hepatobiliary: No focal liver abnormality is seen. No gallstones, gallbladder wall thickening, or biliary dilatation. Pancreas: Unremarkable. No pancreatic ductal dilatation or surrounding inflammatory changes. Spleen: Normal in size without focal abnormality. Adrenals/Urinary Tract: Adrenal glands are unremarkable. Kidneys are normal, without renal calculi, focal lesion, or hydronephrosis. Bladder is unremarkable. Stomach/Bowel: No bowel obstruction or ileus lower quadrant. No bowel wall thickening normal appendix right or inflammatory changes. Vascular/Lymphatic: No significant vascular findings are present. No enlarged abdominal or pelvic lymph nodes. Reproductive: Prostate is unremarkable. Other: There is a large fat containing right inguinal hernia. No bowel herniation. No free fluid or free gas. Musculoskeletal: No acute or destructive bony lesions. Reconstructed images demonstrate no additional findings. IMPRESSION: 1. Large fat  containing right inguinal hernia. No bowel herniation. 2. Otherwise unremarkable exam. Electronically Signed   By: Sharlet Salina M.D.   On: 07/28/2019 14:14   DG Chest Port 1 View  Result Date: 07/28/2019 CLINICAL DATA:  Fever, body aches, asthma, tobacco abuse EXAM: PORTABLE CHEST 1 VIEW COMPARISON:  08/12/2017 FINDINGS: The heart size and mediastinal contours are within normal limits. Both lungs are clear. The visualized skeletal structures are unremarkable. IMPRESSION: No active disease. Electronically Signed   By: Sharlet Salina M.D.   On: 07/28/2019 12:16     ____________________________________________    PROCEDURES  Procedure(s) performed:    Procedures    Medications  acetaminophen (TYLENOL) tablet 650 mg (650 mg Oral Given 07/28/19 0928)  ondansetron (ZOFRAN) injection 4 mg (4 mg Intravenous Given 07/28/19 1143)  sodium chloride 0.9 % bolus 500 mL (0 mLs Intravenous Stopped 07/28/19 1307)  iohexol (OMNIPAQUE) 300 MG/ML solution 100 mL (100 mLs Intravenous Contrast Given 07/28/19 1402)  cefTRIAXone (ROCEPHIN) 1 g in sodium chloride 0.9 % 100 mL IVPB (0 g Intravenous Stopped 07/28/19 1616)  azithromycin (ZITHROMAX) tablet 500 mg (500 mg Oral Given 07/28/19 1532)     ____________________________________________   INITIAL IMPRESSION / ASSESSMENT AND PLAN / ED COURSE  Pertinent labs & imaging results that were available during my care of the patient were reviewed by me and considered in my medical decision making (see chart for details).  Review of the [place] CSRS was performed in accordance of the [facility] prior to dispensing any controlled drugs.   Patient presented to emergency department for evaluation of URI symptoms, vomiting, diarrhea. Vital signs and exam are reassuring.  Chest x-ray negative for acute cardiopulmonary processes.  CT abdomen and pelvis are is negative for acute abnormalities.  CT abdomen shows a fat-containing right inguinal hernia that patient was unaware of.  He denies ever having pain with this.  He denies any pain currently.  Lab work remarkable for AST 46, ALT 65.  Patient states that he was drinking moonshine to try to get rid of his cold, which is likely contributing to his mild elevation in LFTs. Covid and influenza are negative. Strep is negative. Patient was given fluids in the emergency department.  He is given a dose of IV Zofran for nausea and vomiting.  He has not had any further vomiting in the emergency department.  He has not had any diarrhea in the emergency department.   Patient appears well and is  staying well hydrated. Patient should alternate tylenol and ibuprofen for fever. Patient feels comfortable going home. Patient will be discharged home with prescriptions for azithromycin.  He will discontinue amoxicillin.  Patient is to follow up with primary care as needed or otherwise directed. Patient is given ED precautions to return to the ED for any worsening or new symptoms.   Khing Belcher was evaluated in Emergency Department on 07/28/2019 for the symptoms described in the history of present illness. He was evaluated in the context of the global COVID-19 pandemic, which necessitated consideration that the patient might be at risk for infection with the SARS-CoV-2 virus that causes COVID-19. Institutional protocols and algorithms that pertain to the evaluation of patients at risk for COVID-19 are in a state of rapid change based on information released by regulatory bodies including the CDC and federal and state organizations. These policies and algorithms were followed during the patient's care in the ED.  ____________________________________________  FINAL CLINICAL IMPRESSION(S) / ED DIAGNOSES  Final diagnoses:  Upper respiratory tract  infection, unspecified type      NEW MEDICATIONS STARTED DURING THIS VISIT:  ED Discharge Orders         Ordered    azithromycin (ZITHROMAX Z-PAK) 250 MG tablet     07/28/19 1522    ondansetron (ZOFRAN) 4 MG tablet  Daily PRN     07/28/19 1522              This chart was dictated using voice recognition software/Dragon. Despite best efforts to proofread, errors can occur which can change the meaning. Any change was purely unintentional.    Laban Emperor, PA-C 07/28/19 1626    Vanessa , MD 07/29/19 [phone]

## 2019-07-28 NOTE — ED Notes (Signed)
See triage note   Presents with fever and body aches  States he was seen about 3 days ago  Given amoxil and Allegra    States he felt better yesterday  Woke up with fever and body aches this am   Afebrile on arrival

## 2019-08-05 ENCOUNTER — Ambulatory Visit: Payer: Self-pay | Admitting: Gerontology

## 2019-08-07 ENCOUNTER — Ambulatory Visit: Payer: Self-pay | Admitting: Gerontology

## 2019-08-19 ENCOUNTER — Ambulatory Visit: Payer: Self-pay | Admitting: Gerontology

## 2019-10-08 ENCOUNTER — Ambulatory Visit: Payer: Self-pay | Admitting: Gerontology

## 2019-12-10 ENCOUNTER — Other Ambulatory Visit: Payer: Self-pay

## 2019-12-10 ENCOUNTER — Ambulatory Visit
Admission: EM | Admit: 2019-12-10 | Discharge: 2019-12-10 | Disposition: A | Discharge status: Home / Self Care | Payer: Self-pay | Attending: Family Medicine | Admitting: Family Medicine

## 2019-12-10 DIAGNOSIS — R300 Dysuria: Secondary | ICD-10-CM | POA: Insufficient documentation

## 2019-12-10 DIAGNOSIS — Z113 Encounter for screening for infections with a predominantly sexual mode of transmission: Secondary | ICD-10-CM | POA: Insufficient documentation

## 2019-12-10 LAB — POCT URINALYSIS DIP (MANUAL ENTRY)
Bilirubin, UA: NEGATIVE
Glucose, UA: NEGATIVE mg/dL
Ketones, POC UA: NEGATIVE mg/dL
Nitrite, UA: NEGATIVE
Protein Ur, POC: 30 mg/dL — AB
Spec Grav, UA: 1.015 (ref 1.010–1.025)
Urobilinogen, UA: 1 E.U./dL
pH, UA: 6 (ref 5.0–8.0)

## 2019-12-10 MED ORDER — CEFTRIAXONE SODIUM 500 MG IJ SOLR
500.0000 mg | Freq: Once | INTRAMUSCULAR | Status: AC
  Administered 2019-12-10: 500 mg via INTRAMUSCULAR

## 2019-12-10 MED ORDER — METRONIDAZOLE 500 MG PO TABS
500.0000 mg | ORAL_TABLET | Freq: Two times a day (BID) | ORAL | 0 refills | Status: DC
Start: 2019-12-10 — End: 2021-05-16

## 2019-12-10 MED ORDER — AZITHROMYCIN 500 MG PO TABS
1000.0000 mg | ORAL_TABLET | Freq: Once | ORAL | Status: AC
  Administered 2019-12-10: 1000 mg via ORAL

## 2019-12-10 NOTE — Discharge Instructions (Addendum)
UA negative for infection  You have received Rocephin (antibiotic) as an injection in the office  You have also received azithromycin (antibiotic) in the office  I have sent in metronidazole for you to take twice a day for 7 days. Do not drink alcohol with this medication  Your swab tests are pending.  If your test results are positive, we will call you.  You may need additional treatment and your partner(s) may also need treatment.

## 2019-12-10 NOTE — ED Provider Notes (Signed)
[facility]   [id number] 12/10/19 Arrival Time: 1231   CC: CONCERN FOR STD  SUBJECTIVE:  Kevin Bullock is a 24 y.o. male who presents requesting STI screening. Partner asymptomatic.  Reports that he is experiencing dysuria intermittently. Denies similar symptoms in the past.  Expressing desire for STD treatment in the office today.  Denies fever, chills, nausea, vomiting, abdominal or pelvic pain, penile rashes or lesions, testicular swelling or pain.      ROS: As per HPI.  All other pertinent ROS negative.     Past Medical History:  Diagnosis Date  . Anxiety   . Asthma    History reviewed. No pertinent surgical history. No Known Allergies No current facility-administered medications on file prior to encounter.   Current Outpatient Medications on File Prior to Encounter  Medication Sig Dispense Refill  . brompheniramine-pseudoephedrine-DM 30-2-10 MG/5ML syrup Take 5 mLs by mouth 4 (four) times daily as needed. 120 mL 0  . fexofenadine-pseudoephedrine (ALLEGRA-D) 60-120 MG 12 hr tablet Take 1 tablet by mouth 2 (two) times daily. 20 tablet 0  . hydrOXYzine (ATARAX/VISTARIL) 50 MG tablet Take 1 tablet (50 mg total) by mouth 3 (three) times daily as needed. 30 tablet 0  . ondansetron (ZOFRAN) 4 MG tablet Take 1 tablet (4 mg total) by mouth daily as needed for nausea or vomiting. 12 tablet 0   Social History   Socioeconomic History  . Marital status: Single    Spouse name: Not on file  . Number of children: Not on file  . Years of education: Not on file  . Highest education level: Not on file  Occupational History  . Not on file  Tobacco Use  . Smoking status: Current Every Day Smoker    Packs/day: 0.50    Types: Cigars  . Smokeless tobacco: Never Used  Substance and Sexual Activity  . Alcohol use: Yes  . Drug use: Not on file  . Sexual activity: Yes    Birth control/protection: None  Other Topics Concern  . Not on file  Social History Narrative  . Not  on file   Social Determinants of Health   Financial Resource Strain:   . Difficulty of Paying Living Expenses: Not on file  Food Insecurity:   . Worried About Programme researcher, broadcasting/film/video in the Last Year: Not on file  . Ran Out of Food in the Last Year: Not on file  Transportation Needs:   . Lack of Transportation (Medical): Not on file  . Lack of Transportation (Non-Medical): Not on file  Physical Activity:   . Days of Exercise per Week: Not on file  . Minutes of Exercise per Session: Not on file  Stress:   . Feeling of Stress : Not on file  Social Connections:   . Frequency of Communication with Friends and Family: Not on file  . Frequency of Social Gatherings with Friends and Family: Not on file  . Attends Religious Services: Not on file  . Active Member of Clubs or Organizations: Not on file  . Attends Banker Meetings: Not on file  . Marital Status: Not on file  Intimate Partner Violence:   . Fear of Current or Ex-Partner: Not on file  . Emotionally Abused: Not on file  . Physically Abused: Not on file  . Sexually Abused: Not on file   Family History  Problem Relation Age of Onset  . Healthy Mother   . Healthy Father     OBJECTIVE:  Vitals:  12/10/19 1240  BP: 128/83  Pulse: 85  Resp: 18  Temp: 99 F (37.2 C)  TempSrc: Oral  SpO2: 98%     General appearance: alert, NAD, appears stated age Head: NCAT Throat: lips, mucosa, and tongue normal; teeth and gums normal Lungs: CTA bilaterally without adventitious breath sounds Heart: regular rate and rhythm.  Radial pulses 2+ symmetrical bilaterally Back: no CVA tenderness Abdomen: soft, non-tender; bowel sounds normal; no masses or organomegaly; no guarding or rebound tenderness GU: deferred Skin: warm and dry Psychological:  Alert and cooperative. Normal mood and affect.  LABS:  Results for orders placed or performed during the hospital encounter of 07/28/19  Group A Strep by PCR ([facility] Only)    Specimen: Throat; Sterile Swab  Result Value Ref Range   Group A Strep by PCR NOT DETECTED NOT DETECTED  Respiratory Panel by RT PCR (Flu A&B, Covid) - Nasopharyngeal Swab   Specimen: Nasopharyngeal Swab  Result Value Ref Range   SARS Coronavirus 2 by RT PCR NEGATIVE NEGATIVE   Influenza A by PCR NEGATIVE NEGATIVE   Influenza B by PCR NEGATIVE NEGATIVE  CBC with Differential  Result Value Ref Range   WBC 8.6 4.0 - 10.5 K/uL   RBC 5.59 4.22 - 5.81 MIL/uL   Hemoglobin 16.7 13.0 - 17.0 g/dL   HCT 44.9 39 - 52 %   MCV 86.4 80.0 - 100.0 fL   MCH 29.9 26.0 - 34.0 pg   MCHC 34.6 30.0 - 36.0 g/dL   RDW 67.5 91.6 - 38.4 %   Platelets 195 150 - 400 K/uL   nRBC 0.0 0.0 - 0.2 %   Neutrophils Relative % 92 %   Neutro Abs 8.0 (H) 1.7 - 7.7 K/uL   Lymphocytes Relative 4 %   Lymphs Abs 0.3 (L) 0.7 - 4.0 K/uL   Monocytes Relative 4 %   Monocytes Absolute 0.3 0 - 1 K/uL   Eosinophils Relative 0 %   Eosinophils Absolute 0.0 0 - 0 K/uL   Basophils Relative 0 %   Basophils Absolute 0.0 0 - 0 K/uL   Immature Granulocytes 0 %   Abs Immature Granulocytes 0.02 0.00 - 0.07 K/uL  Comprehensive metabolic panel  Result Value Ref Range   Sodium 137 135 - 145 mmol/L   Potassium 4.1 3.5 - 5.1 mmol/L   Chloride 104 98 - 111 mmol/L   CO2 23 22 - 32 mmol/L   Glucose, Bld 130 (H) 70 - 99 mg/dL   BUN 14 6 - 20 mg/dL   Creatinine, Ser 6.65 0.61 - 1.24 mg/dL   Calcium 9.6 8.9 - 99.3 mg/dL   Total Protein 8.0 6.5 - 8.1 g/dL   Albumin 4.4 3.5 - 5.0 g/dL   AST 46 (H) 15 - 41 U/L   ALT 65 (H) 0 - 44 U/L   Alkaline Phosphatase 80 38 - 126 U/L   Total Bilirubin 0.6 0.3 - 1.2 mg/dL   GFR calc non Af Amer >60 >60 mL/min   GFR calc Af Amer >60 >60 mL/min   Anion gap 10 5 - 15  Lipase, blood  Result Value Ref Range   Lipase 33 11 - 51 U/L    Labs Reviewed  POCT URINALYSIS DIP (MANUAL ENTRY)  CYTOLOGY, (ORAL, ANAL, URETHRAL) ANCILLARY ONLY    ASSESSMENT & PLAN:  1. Screen for STD (sexually transmitted  disease)   2. Dysuria     Meds ordered this encounter  Medications  . azithromycin (ZITHROMAX) tablet 1,000  mg  . cefTRIAXone (ROCEPHIN) injection 500 mg  . metroNIDAZOLE (FLAGYL) 500 MG tablet    Sig: Take 1 tablet (500 mg total) by mouth 2 (two) times daily.    Dispense:  14 tablet    Refill:  0    Order Specific Question:   Supervising Provider    Answer:   Merrilee Jansky [[id number]]    Pending: Labs Reviewed  POCT URINALYSIS DIP (MANUAL ENTRY)  CYTOLOGY, (ORAL, ANAL, URETHRAL) ANCILLARY ONLY    Given rocephin 250mg  injection and azithromycin 1g in office Penile self swab obtained  Declines HIV/ syphilis testing today Prescribed metronidazole 500 mg twice daily for 7 days (do not take while consuming alcohol)  Take medications as prescribed and to completion We will follow up with you regarding the results of your test If tests are positive, please abstain from sexual activity until you and your partner(s) are treated  Follow up with PCP or [facility] if symptoms persists Return here or go to ER if you have any new or worsening symptoms    Reviewed expectations re: course of current medical issues. Questions answered. Outlined signs and symptoms indicating need for more acute intervention. Patient verbalized understanding. After Visit Summary given.       , NP 12/10/19 1315

## 2019-12-10 NOTE — ED Triage Notes (Signed)
Pt is here with penile tingling when urinating for 2 days now, pt has not taken anything to relieve discomfort.

## 2019-12-12 LAB — CYTOLOGY, (ORAL, ANAL, URETHRAL) ANCILLARY ONLY
Chlamydia: NEGATIVE
Comment: NEGATIVE
Comment: NEGATIVE
Comment: NORMAL
Neisseria Gonorrhea: NEGATIVE
Trichomonas: POSITIVE — AB

## 2020-03-07 ENCOUNTER — Emergency Department
Admission: EM | Admit: 2020-03-07 | Discharge: 2020-03-07 | Discharge status: Against Medical Advice | Payer: PRIVATE HEALTH INSURANCE

## 2021-03-01 ENCOUNTER — Other Ambulatory Visit: Payer: Self-pay

## 2021-03-01 ENCOUNTER — Emergency Department
Admission: EM | Admit: 2021-03-01 | Discharge: 2021-03-01 | Disposition: A | Discharge status: Home / Self Care | Payer: PRIVATE HEALTH INSURANCE | Attending: Student in an Organized Health Care Education/Training Program | Admitting: Student in an Organized Health Care Education/Training Program

## 2021-03-01 DIAGNOSIS — Z20822 Contact with and (suspected) exposure to covid-19: Secondary | ICD-10-CM | POA: Insufficient documentation

## 2021-03-01 DIAGNOSIS — B349 Viral infection, unspecified: Secondary | ICD-10-CM

## 2021-03-01 DIAGNOSIS — J45909 Unspecified asthma, uncomplicated: Secondary | ICD-10-CM | POA: Insufficient documentation

## 2021-03-01 DIAGNOSIS — F17210 Nicotine dependence, cigarettes, uncomplicated: Secondary | ICD-10-CM | POA: Insufficient documentation

## 2021-03-01 LAB — RESP PANEL BY RT-PCR (FLU A&B, COVID) ARPGX2
Influenza A by PCR: NEGATIVE
Influenza B by PCR: NEGATIVE
SARS Coronavirus 2 by RT PCR: NEGATIVE

## 2021-03-01 MED ORDER — DICYCLOMINE HCL 10 MG PO CAPS: 10.0000 mg | ORAL_CAPSULE | Freq: Three times a day (TID) | ORAL | 0 refills | Status: DC

## 2021-03-01 MED ORDER — ACETAMINOPHEN 325 MG PO TABS: 650.0000 mg | ORAL_TABLET | Freq: Once | ORAL | Status: DC | PRN

## 2021-03-01 MED ORDER — ONDANSETRON 4 MG PO TBDP: 4.0000 mg | ORAL_TABLET | Freq: Three times a day (TID) | ORAL | 0 refills | Status: DC | PRN

## 2021-03-01 MED ORDER — DICYCLOMINE HCL 10 MG PO CAPS
10.0000 mg | ORAL_CAPSULE | Freq: Once | ORAL | Status: AC
  Administered 2021-03-01: 10 mg via ORAL

## 2021-03-01 MED ORDER — ACETAMINOPHEN 500 MG PO TABS
1000.0000 mg | ORAL_TABLET | Freq: Once | ORAL | Status: AC | PRN
  Administered 2021-03-01: 1000 mg via ORAL

## 2021-03-01 NOTE — ED Triage Notes (Signed)
Pt reports fever, chronic pains, headache for the past few days. Friend checked in for similar sx with him. Reports taking tylenol with relief. Ambulatory, NAD noted

## 2021-03-01 NOTE — ED Notes (Signed)
Pt to ED for fever, headache and abdominal pain that has occurred since 11/24. Pt states he took his temp yesterday and it was 102. Pt has unlabored respirations, NAD.

## 2021-03-01 NOTE — Discharge Instructions (Signed)
Take tylenol or ibuprofen if needed for fever, sore throat and body aches. Take the ondansetron (Zofran) if needed for nausea and take the Bentyl for abdominal cramping.  Read the instructions for foods that may help with diarrhea. Follow up with primary care for symptoms that are not improving over the next few days. Return to the ER for symptoms that change or worsen.

## 2021-03-01 NOTE — ED Provider Notes (Signed)
[facility] Emergency Department Provider Note ____________________________________________   Event Date/Time   First MD Initiated Contact with Patient 03/01/21 [phone]     (approximate)  I have reviewed the triage vital signs and the nursing notes.   HISTORY  Chief Complaint Fever  HPI Kevin Bullock is a 25 y.o. male with history of asthma presents to the emergency department for treatment and evaluation of fever, headache, one episode of vomiting, and multiple episodes of diarrhea and abdominal cramping. Symptoms started yesterday.      Past Medical History:  Diagnosis Date   Anxiety    Asthma     There are no problems to display for this patient.   No past surgical history on file.  Prior to Admission medications   Medication Sig Start Date End Date Taking? Authorizing Provider  dicyclomine (BENTYL) 10 MG capsule Take 1 capsule (10 mg total) by mouth 4 (four) times daily -  before meals and at bedtime. 03/01/21  Yes Elycia Woodside B, FNP  ondansetron (ZOFRAN-ODT) 4 MG disintegrating tablet Take 1 tablet (4 mg total) by mouth every 8 (eight) hours as needed for nausea or vomiting. 03/01/21  Yes Merril Nagy B, FNP  brompheniramine-pseudoephedrine-DM 30-2-10 MG/5ML syrup Take 5 mLs by mouth 4 (four) times daily as needed. 06/05/18   Tommi Rumps, PA-C  fexofenadine-pseudoephedrine (ALLEGRA-D) 60-120 MG 12 hr tablet Take 1 tablet by mouth 2 (two) times daily. 07/25/19   Joni Reining, PA-C  hydrOXYzine (ATARAX/VISTARIL) 50 MG tablet Take 1 tablet (50 mg total) by mouth 3 (three) times daily as needed. 07/25/19   Joni Reining, PA-C  metroNIDAZOLE (FLAGYL) 500 MG tablet Take 1 tablet (500 mg total) by mouth 2 (two) times daily. 12/10/19   Moshe Cipro, NP    Allergies Patient has no known allergies.  Family History  Problem Relation Age of Onset   Healthy Mother    Healthy Father     Social History Social History   Tobacco Use    Smoking status: Every Day    Packs/day: 0.50    Types: Cigars, Cigarettes   Smokeless tobacco: Never  Substance Use Topics   Alcohol use: Yes    Review of Systems  Constitutional: No fever/chills Eyes: No visual changes. ENT: No sore throat. Cardiovascular: Denies chest pain. Respiratory: Denies shortness of breath. Gastrointestinal: Positive for abdominal pain. Positive for nausea, vomiting, diarrhea. Genitourinary: Negative for dysuria. Musculoskeletal: Negative for back pain. Skin: Negative for rash. Neurological: Positive for headaches, negative for focal weakness or numbness  ____________________________________________   PHYSICAL EXAM:  VITAL SIGNS: ED Triage Vitals  Enc Vitals Group     BP 03/01/21 0859 128/82     Pulse Rate 03/01/21 0859 (!) 126     Resp 03/01/21 0859 18     Temp 03/01/21 0859 (!) 100.5 F (38.1 C)     Temp Source 03/01/21 0859 Oral     SpO2 03/01/21 0859 95 %     Weight 03/01/21 0853 175 lb (79.4 kg)     Height 03/01/21 0853 5\' 7"  (1.702 m)     Head Circumference --      Peak Flow --      Pain Score 03/01/21 0853 6     Pain Loc --      Pain Edu? --      Excl. in [place]? --     Constitutional: Alert and oriented. Well appearing and in no acute distress. Eyes: Conjunctivae are normal.  Head:  Atraumatic. Nose: No congestion/rhinnorhea. Mouth/Throat: Mucous membranes are moist.  Oropharynx mildly erythematous. Neck: No stridor.   Hematological/Lymphatic/Immunilogical: No cervical lymphadenopathy. Cardiovascular: Normal rate, regular rhythm. Grossly normal heart sounds.  Good peripheral circulation. Respiratory: Normal respiratory effort.  No retractions. Lungs CTAB. Gastrointestinal: Soft and nontender. No distention. No abdominal bruits.  Musculoskeletal: No lower extremity tenderness nor edema.  No joint effusions. Neurologic:  Normal speech and language. No gross focal neurologic deficits are appreciated. No gait instability. Skin:   Skin is warm, dry and intact. No rash noted. Psychiatric: Mood and affect are normal. Speech and behavior are normal.  ____________________________________________   LABS (all labs ordered are listed, but only abnormal results are displayed)  Labs Reviewed  RESP PANEL BY RT-PCR (FLU A&B, COVID) ARPGX2   ____________________________________________  EKG  Not indicated. ____________________________________________  RADIOLOGY  ED MD interpretation:    Not indicated.  I, Kem Boroughs, personally viewed and evaluated these images (plain radiographs) as part of my medical decision making, as well as reviewing the written report by the radiologist.  Official radiology report(s): No results found.  ____________________________________________   PROCEDURES  Procedure(s) performed (including Critical Care):  Procedures  ____________________________________________   INITIAL IMPRESSION / ASSESSMENT AND PLAN     25 year old male presenting to the emergency department for treatment and evaluation of fever, nausea, vomiting, diarrhea, headaches, body aches for the past couple days.  See HPI for further details.  Plan will be to get a COVID and influenza swab and give Tylenol for fever which hopefully will resolve tachycardia as well.  DIFFERENTIAL DIAGNOSIS  COVID, influenza, viral syndrome  ED COURSE  COVID and influenza negative.  Patient feels better after Bentyl and Zofran.  He remains mildly febrile but tachycardia has resolved.  Plan will be to send prescriptions for the same.  He is to follow-up with primary care or return to the emergency department for symptoms that are not improving over the week.     As part of my medical decision making, I reviewed the following data within the electronic MEDICAL RECORD NUMBER   ___________________________________________   FINAL CLINICAL IMPRESSION(S) / ED DIAGNOSES  Final diagnoses:  Viral syndrome     ED Discharge  Orders          Ordered    ondansetron (ZOFRAN-ODT) 4 MG disintegrating tablet  Every 8 hours PRN        03/01/21 1130    dicyclomine (BENTYL) 10 MG capsule  3 times daily before meals & bedtime        03/01/21 1130             Kevin Bullock was evaluated in Emergency Department on 03/01/2021 for the symptoms described in the history of present illness. He was evaluated in the context of the global COVID-19 pandemic, which necessitated consideration that the patient might be at risk for infection with the SARS-CoV-2 virus that causes COVID-19. Institutional protocols and algorithms that pertain to the evaluation of patients at risk for COVID-19 are in a state of rapid change based on information released by regulatory bodies including the CDC and federal and state organizations. These policies and algorithms were followed during the patient's care in the ED.   Note:  This document was prepared using Dragon voice recognition software and may include unintentional dictation errors.    Chinita Pester, FNP 03/01/21 1227    Willy Eddy, MD 03/01/21 1544

## 2021-05-16 ENCOUNTER — Other Ambulatory Visit: Payer: Self-pay

## 2021-05-16 ENCOUNTER — Emergency Department
Admission: EM | Admit: 2021-05-16 | Discharge: 2021-05-16 | Disposition: A | Discharge status: Home / Self Care | Payer: Self-pay | Attending: Emergency Medicine | Admitting: Emergency Medicine

## 2021-05-16 DIAGNOSIS — H6982 Other specified disorders of Eustachian tube, left ear: Secondary | ICD-10-CM

## 2021-05-16 DIAGNOSIS — H6992 Unspecified Eustachian tube disorder, left ear: Secondary | ICD-10-CM | POA: Insufficient documentation

## 2021-05-16 DIAGNOSIS — J039 Acute tonsillitis, unspecified: Secondary | ICD-10-CM | POA: Insufficient documentation

## 2021-05-16 LAB — GROUP A STREP BY PCR: Group A Strep by PCR: NOT DETECTED

## 2021-05-16 NOTE — ED Provider Notes (Signed)
[facility] Provider Note    Event Date/Time   First MD Initiated Contact with Patient 05/16/21 1357     (approximate)   History   Sore Throat   HPI  Kevin Bullock is a 26 y.o. male that has significant past medical history who presents for assessment of some soreness and swelling of bilateral tonsils he stated he noticed in the last 12 hours.  No other specific sore throat, shortness of breath, fevers or right ear pain but he has noticed little pressure in the left ear.  No congestion, nausea, vomiting, diarrhea, rash or any other associated sick symptoms.  Patient declines analgesia at this time.      Physical Exam  Triage Vital Signs: ED Triage Vitals  Enc Vitals Group     BP 05/16/21 1301 139/84     Pulse Rate 05/16/21 1301 71     Resp 05/16/21 1300 20     Temp 05/16/21 1300 98.3 F (36.8 C)     Temp Source 05/16/21 1300 Oral     SpO2 05/16/21 1301 99 %     Weight 05/16/21 1301 185 lb (83.9 kg)     Height 05/16/21 1301 5\' 8"  (1.727 m)     Head Circumference --      Peak Flow --      Pain Score 05/16/21 1301 0     Pain Loc --      Pain Edu? --      Excl. in [place]? --     Most recent vital signs: Vitals:   05/16/21 1300 05/16/21 1301  BP:  139/84  Pulse:  71  Resp: 20   Temp: 98.3 F (36.8 C)   SpO2:  99%    General: Awake, no distress.  CV:  Good peripheral perfusion.  Resp:  Normal effort.  Abd:  No distention.  Other:  BMs are unremarkable bilaterally.  There are some mild posterior oropharyngeal erythema without any significant uvular deviation or exudates.  Tonsils are slightly enlarged bilaterally.  Stridor over the neck.   ED Results / Procedures / Treatments  Labs (all labs ordered are listed, but only abnormal results are displayed) Labs Reviewed  GROUP A STREP BY PCR     EKG     RADIOLOGY   PROCEDURES:   MEDICATIONS ORDERED IN ED: Medications - No data to display   IMPRESSION / MDM / [place] / ED COURSE  I reviewed the triage vital signs and the nursing notes.                              Differential diagnosis includes, but is not limited to suspect likely an acute viral URI with component of mild tonsillitis.  He has no fever or difficulty swallowing, throat pain or exudates to suggest bacterial tonsillitis.  There is no other evidence of deep space infection of the head or neck and I suspect left ear pressure may be from some eustachian tube dysfunction.  He does not otherwise appear septic or meningitic.  Strep screen sent.  This is negative.  Low suspicion for immediate life-threatening process.  Discharged in stable condition.  Strict return precautions advised and discussed.      FINAL CLINICAL IMPRESSION(S) / ED DIAGNOSES   Final diagnoses:  Tonsillitis  Dysfunction of left eustachian tube     Rx / DC Orders   ED Discharge Orders     None  Note:  This document was prepared using Dragon voice recognition software and may include unintentional dictation errors.   Lucrezia Starch, MD 05/16/21 [phone]

## 2021-05-16 NOTE — ED Triage Notes (Signed)
Pt to ED for "swollen tonsils" that started this morning. Denies fevers.

## 2021-10-07 ENCOUNTER — Other Ambulatory Visit: Payer: Self-pay

## 2021-10-07 ENCOUNTER — Emergency Department (HOSPITAL_COMMUNITY)
Admission: EM | Admit: 2021-10-07 | Discharge: 2021-10-07 | Discharge status: Against Medical Advice | Payer: Self-pay | Attending: Emergency Medicine | Admitting: Emergency Medicine

## 2021-10-07 ENCOUNTER — Encounter (HOSPITAL_COMMUNITY): Payer: Self-pay

## 2021-10-07 DIAGNOSIS — F419 Anxiety disorder, unspecified: Secondary | ICD-10-CM | POA: Insufficient documentation

## 2021-10-07 DIAGNOSIS — F410 Panic disorder [episodic paroxysmal anxiety] without agoraphobia: Secondary | ICD-10-CM | POA: Insufficient documentation

## 2021-10-07 DIAGNOSIS — Z5321 Procedure and treatment not carried out due to patient leaving prior to being seen by health care provider: Secondary | ICD-10-CM | POA: Insufficient documentation

## 2021-10-07 NOTE — ED Triage Notes (Signed)
Pt presents to ED via EMS with c/o panic attack/anxiety after eating THC edible around 8 pm, pt says right side pain started after eating edible. Pt has hx of anxiety per report. Pt says he has calmed down some since arrival.

## 2023-04-12 ENCOUNTER — Other Ambulatory Visit: Payer: Self-pay

## 2023-04-12 ENCOUNTER — Emergency Department: Payer: No Typology Code available for payment source

## 2023-04-12 ENCOUNTER — Emergency Department
Admission: EM | Admit: 2023-04-12 | Discharge: 2023-04-12 | Disposition: A | Discharge status: Home / Self Care | Payer: No Typology Code available for payment source | Attending: Emergency Medicine | Admitting: Emergency Medicine

## 2023-04-12 DIAGNOSIS — X509XXA Other and unspecified overexertion or strenuous movements or postures, initial encounter: Secondary | ICD-10-CM | POA: Diagnosis not present

## 2023-04-12 DIAGNOSIS — S29012A Strain of muscle and tendon of back wall of thorax, initial encounter: Secondary | ICD-10-CM | POA: Insufficient documentation

## 2023-04-12 DIAGNOSIS — S299XXA Unspecified injury of thorax, initial encounter: Secondary | ICD-10-CM | POA: Diagnosis present

## 2023-04-12 MED ORDER — KETOROLAC TROMETHAMINE 30 MG/ML IJ SOLN
30.0000 mg | Freq: Once | INTRAMUSCULAR | Status: AC
  Administered 2023-04-12: 30 mg via INTRAMUSCULAR
  Filled 2023-04-12: qty 1

## 2023-04-12 MED ORDER — ACETAMINOPHEN 500 MG PO TABS
1000.0000 mg | ORAL_TABLET | Freq: Once | ORAL | Status: AC
Start: 2023-04-12 — End: 2023-04-12
  Administered 2023-04-12: 1000 mg via ORAL
  Filled 2023-04-12: qty 2

## 2023-04-12 MED ORDER — LIDOCAINE 5 % EX PTCH: 1.0000 | MEDICATED_PATCH | Freq: Two times a day (BID) | CUTANEOUS | 0 refills | Status: DC

## 2023-04-12 MED ORDER — LIDOCAINE 5 % EX PTCH
1.0000 | MEDICATED_PATCH | CUTANEOUS | Status: DC
  Administered 2023-04-12: 1 via TRANSDERMAL
  Filled 2023-04-12: qty 1

## 2023-04-12 NOTE — Discharge Instructions (Addendum)
 Take acetaminophen  650 mg and ibuprofen  400 mg every 6 hours for pain.  Take with food. Lidocaine  patch as prescribed.   Thank you for choosing us  for your health care today!  Please see your primary doctor this week for a follow up appointment.   If you have any new, worsening, or unexpected symptoms call your doctor right away or come back to the emergency department for reevaluation.  It was my pleasure to care for you today.   Ginnie EDISON Cyrena, MD

## 2023-04-12 NOTE — ED Provider Notes (Addendum)
 [facility] Provider Note    Event Date/Time   First MD Initiated Contact with Patient 04/12/23 [phone]     (approximate)   History   Back Pain   HPI  Kevin Bullock is a 28 y.o. male   Past medical history of no significant past medical history presents emerged part with back pain after his large dog pulled the leash and he was pulled forward.  He has pain around the posterior back underneath his scapula and paraspinal thoracic pain on the right side.  He did not fall or strike his head.      Physical Exam   Triage Vital Signs: ED Triage Vitals [04/12/23 0009]  Encounter Vitals Group     BP (!) 174/99     Systolic BP Percentile      Diastolic BP Percentile      Pulse Rate 96     Resp 16     Temp 99.8 F (37.7 C)     Temp Source Oral     SpO2 98 %     Weight      Height      Head Circumference      Peak Flow      Pain Score 10     Pain Loc      Pain Education      Exclude from Growth Chart     Most recent vital signs: Vitals:   04/12/23 0009  BP: (!) 174/99  Pulse: 96  Resp: 16  Temp: 99.8 F (37.7 C)  SpO2: 98%    General: Awake, no distress.  CV:  Good peripheral perfusion.  Resp:  Normal effort.  Abd:  No distention.  Other:  Tenderness palpation around the thoracic paraspinal right side, full active range of motion of the right upper extremity, breathing comfortably.   ED Results / Procedures / Treatments   Labs (all labs ordered are listed, but only abnormal results are displayed) Labs Reviewed - No data to display   RADIOLOGY I independently reviewed and interpreted x-ray of the thoracic spine see no obvious fracture or dislocation I also reviewed radiologist's formal read.   PROCEDURES:  Critical Care performed: No  Procedures   MEDICATIONS ORDERED IN ED: Medications  ketorolac  (TORADOL ) 30 MG/ML injection 30 mg (has no administration in time range)  acetaminophen  (TYLENOL ) tablet 1,000 mg (has no  administration in time range)  lidocaine  (LIDODERM ) 5 % 1 patch (has no administration in time range)    IMPRESSION / MDM / ASSESSMENT AND PLAN / ED COURSE  I reviewed the triage vital signs and the nursing notes.                                Patient's presentation is most consistent with acute presentation with potential threat to life or bodily function.  Differential diagnosis includes, but is not limited to, muscle strain, fracture dislocation   MDM:   Most likely muscle strain.  X-ray negative for fracture or dislocation.  Patient stable.  Pain medications given, anticipatory guidance and follow-up with PMD.       FINAL CLINICAL IMPRESSION(S) / ED DIAGNOSES   Final diagnoses:  Upper back strain, initial encounter     Rx / DC Orders   ED Discharge Orders          Ordered    lidocaine  (LIDODERM ) 5 %  Every 12 hours  04/12/23 0421    Ambulatory Referral to Primary Care (Establish Care)        04/12/23 0422             Note:  This document was prepared using Dragon voice recognition software and may include unintentional dictation errors.    Cyrena Mylar, MD 04/12/23 9488    Cyrena Mylar, MD 04/12/23 [phone]

## 2023-04-12 NOTE — ED Triage Notes (Signed)
 Pt to ed from home via POV for back pain x 48 hours. Pt was getting his dogs out of their kennel and one jerked him up and pulled on the right side. Pt is caox4, in no acute distress and ambulatory in triage.   Pt has pain in right shoulder blade area.

## 2023-04-19 ENCOUNTER — Ambulatory Visit: Payer: Self-pay | Admitting: Surgery

## 2023-04-19 ENCOUNTER — Telehealth: Payer: Self-pay | Admitting: Surgery

## 2023-04-19 ENCOUNTER — Encounter: Payer: Self-pay | Admitting: Surgery

## 2023-04-19 VITALS — BP 133/88 | HR 75 | Temp 98.0°F | Ht 67.0 in | Wt 194.0 lb

## 2023-04-19 DIAGNOSIS — K4090 Unilateral inguinal hernia, without obstruction or gangrene, not specified as recurrent: Secondary | ICD-10-CM

## 2023-04-19 NOTE — Progress Notes (Signed)
Patient ID: Kevin Bullock, male   DOB: [birth year], 28 y.o.   MRN: [id number]  Chief Complaint: Right inguinal hernia for >1 year  History of Present Illness Kevin Bullock is a 28 y.o. male with over a 1 year history of a right groin bulge with associated pain/discomfort.  He reports not having sought out surgical consultation due to a lack of insurance.  He denies any nausea, vomiting, fevers or chills.  His bowel habits have been unchanged/unaltered by the presence of the hernia.  He reports he does not do any heavy lifting at his work, and has not readily exacerbated with activity.  He reports it is worse when laying down, he typically lays on his right or left side.  He reports the mass has been difficult to hide.  Past Medical History Past Medical History:  Diagnosis Date   Anxiety    Asthma       No past surgical history on file.  No Known Allergies  No current outpatient medications on file.   No current facility-administered medications for this visit.    Family History Family History  Problem Relation Age of Onset   Healthy Mother    Healthy Father       Social History Social History   Tobacco Use   Smoking status: Every Day    Current packs/day: 0.50    Types: Cigars, Cigarettes    Passive exposure: Past   Smokeless tobacco: Never  Vaping Use   Vaping status: Never Used  Substance Use Topics   Alcohol use: Yes        Review of Systems  Constitutional: Negative.   HENT: Negative.    Eyes: Negative.   Respiratory: Negative.    Cardiovascular: Negative.   Gastrointestinal: Negative.   Genitourinary: Negative.   Skin: Negative.   Neurological: Negative.   Psychiatric/Behavioral: Negative.       Physical Exam Blood pressure 133/88, pulse 75, temperature 98 F (36.7 C), height 5\' 7"  (1.702 m), weight 194 lb (88 kg), SpO2 99%. Last Weight  Most recent update: 04/19/2023 10:21 AM    Weight  88 kg (194 lb)             CONSTITUTIONAL: Well  developed, and nourished, appropriately responsive and aware without distress.   EYES: Sclera non-icteric.   EARS, NOSE, MOUTH AND THROAT:  The oropharynx is clear. Oral mucosa is pink and moist.    Hearing is intact to voice.  NECK: Trachea is midline, and there is no jugular venous distension.  LYMPH NODES:  Lymph nodes in the neck are not appreciated. RESPIRATORY:  Lungs are clear, and breath sounds are equal bilaterally.  Normal respiratory effort without pathologic use of accessory muscles. CARDIOVASCULAR: Heart is regular in rate and rhythm.   Well perfused.  GI: The abdomen is  soft, nontender, and nondistended. There were no palpable masses.  I did not appreciate hepatosplenomegaly.  GU: Large right groin mass readily appreciable in supine position.  It is reducible, with minimal discomfort.  Readily recurs upon patient doing partial crunch maneuver.  Did not evaluate left groin. MUSCULOSKELETAL:  Symmetrical muscle tone appreciated in all four extremities.    SKIN: Skin turgor is normal. No pathologic skin lesions appreciated.  NEUROLOGIC:  Motor and sensation appear grossly normal.  Cranial nerves are grossly without defect. PSYCH:  Alert and oriented to person, place and time. Affect is appropriate for situation.  Data Reviewed I have personally reviewed what is currently available of the patient's  imaging, recent labs and medical records.   Labs:     Latest Ref Rng & Units 07/28/2019   11:36 AM 08/12/2017    9:04 PM  CBC  WBC 4.0 - 10.5 K/uL 8.6  4.8   Hemoglobin 13.0 - 17.0 g/dL 87.5  64.3   Hematocrit 39.0 - 52.0 % 48.3  45.7   Platelets 150 - 400 K/uL 195  177       Latest Ref Rng & Units 07/28/2019   11:36 AM 08/12/2017    9:04 PM  CMP  Glucose 70 - 99 mg/dL 329  518   BUN 6 - 20 mg/dL 14  12   Creatinine 8.41 - 1.24 mg/dL 6.60  6.30   Sodium 160 - 145 mmol/L 137  136   Potassium 3.5 - 5.1 mmol/L 4.1  3.5   Chloride 98 - 111 mmol/L 104  103   CO2 22 - 32 mmol/L 23   26   Calcium 8.9 - 10.3 mg/dL 9.6  9.6   Total Protein 6.5 - 8.1 g/dL 8.0  7.6   Total Bilirubin 0.3 - 1.2 mg/dL 0.6  0.5   Alkaline Phos 38 - 126 U/L 80  68   AST 15 - 41 U/L 46  36   ALT 0 - 44 U/L 65  35     Imaging: Radiological images reviewed:   Within last 24 hrs: No results found.  Assessment    Patient Active Problem List   Diagnosis Date Noted   Right inguinal hernia 04/19/2023    Plan    Robotic repair of right inguinal hernia, possible bilateral.  I discussed possibility of incarceration, strangulation, enlargement in size over time, and the need for emergency surgery in the face of these.  Also reviewed the techniques of reduction should incarceration occur, and when unsuccessful to present to the ED.  Also discussed that surgery risks include recurrence which can be up to 30% in the case of complex hernias, use of prosthetic materials (mesh) and the increased risk of infection and the possible need for re-operation and removal of mesh, possibility of post-op SBO or ileus, and the risks of general anesthetic including heart attack, stroke, sudden death or some reaction to anesthetic medications. The patient, and those present, appear to understand the risks, any and all questions were answered to the patient's satisfaction.  No guarantees were ever expressed or implied.    Face-to-face time spent with the patient and accompanying care providers(if present) was 30 minutes, with more than 50% of the time spent counseling, educating, and coordinating care of the patient.    These notes generated with voice recognition software. I apologize for typographical errors.  Campbell Lerner M.D., FACS 04/19/2023, 1:05 PM

## 2023-04-19 NOTE — Patient Instructions (Signed)
 You have chose to have your hernia repaired. This will be done by Dr. Claudine Mouton at [facility].  Please see your (blue) Pre-care information that you have been given today. Our surgery scheduler will call you to verify surgery date and to go over information.   You will need to arrange to be out of work for approximately 1-2 weeks and then you may return with a lifting restriction for 4 more weeks. If you have FMLA or Disability paperwork that needs to be filled out, please have your company fax your paperwork to [phone] or you may drop this by either office. This paperwork will be filled out within 3 days after your surgery has been completed.  You may have a bruise in your groin and also swelling and brusing in your testicle area. You may use ice 4-5 times daily for 15-20 minutes each time. Make sure that you place a barrier between you and the ice pack. To decrease the swelling, you may roll up a bath towel and place it vertically in between your thighs with your testicles resting on the towel. You will want to keep this area elevated as much as possible for several days following surgery.    Inguinal Hernia, Adult Muscles help keep everything in the body in its proper place. But if a weak spot in the muscles develops, something can poke through. That is called a hernia. When this happens in the lower part of the belly (abdomen), it is called an inguinal hernia. (It takes its name from a part of the body in this region called the inguinal canal.) A weak spot in the wall of muscles lets some fat or part of the small intestine bulge through. An inguinal hernia can develop at any age. Men get them more often than women. CAUSES  In adults, an inguinal hernia develops over time. It can be triggered by: Suddenly straining the muscles of the lower abdomen. Lifting heavy objects. Straining to have a bowel movement. Difficult bowel movements (constipation) can lead to this. Constant coughing.  This may be caused by smoking or lung disease. Being overweight. Being pregnant. Working at a job that requires long periods of standing or heavy lifting. Having had an inguinal hernia before. One type can be an emergency situation. It is called a strangulated inguinal hernia. It develops if part of the small intestine slips through the weak spot and cannot get back into the abdomen. The blood supply can be cut off. If that happens, part of the intestine may die. This situation requires emergency surgery. SYMPTOMS  Often, a small inguinal hernia has no symptoms. It is found when a healthcare provider does a physical exam. Larger hernias usually have symptoms.  In adults, symptoms may include: A lump in the groin. This is easier to see when the person is standing. It might disappear when lying down. In men, a lump in the scrotum. Pain or burning in the groin. This occurs especially when lifting, straining or coughing. A dull ache or feeling of pressure in the groin. Signs of a strangulated hernia can include: A bulge in the groin that becomes very painful and tender to the touch. A bulge that turns red or purple. Fever, nausea and vomiting. Inability to have a bowel movement or to pass gas. DIAGNOSIS  To decide if you have an inguinal hernia, a healthcare provider will probably do a physical examination. This will include asking questions about any symptoms you have noticed. The healthcare provider might  feel the groin area and ask you to cough. If an inguinal hernia is felt, the healthcare provider may try to slide it back into the abdomen. Usually no other tests are needed. TREATMENT  Treatments can vary. The size of the hernia makes a difference. Options include: Watchful waiting. This is often suggested if the hernia is small and you have had no symptoms. No medical procedure will be done unless symptoms develop. You will need to watch closely for symptoms. If any occur, contact your  healthcare provider right away. Surgery. This is used if the hernia is larger or you have symptoms. Open surgery. This is usually an outpatient procedure (you will not stay overnight in a hospital). An cut (incision) is made through the skin in the groin. The hernia is put back inside the abdomen. The weak area in the muscles is then repaired by herniorrhaphy or hernioplasty. Herniorrhaphy: in this type of surgery, the weak muscles are sewn back together. Hernioplasty: a patch or mesh is used to close the weak area in the abdominal wall. Laparoscopy. In this procedure, a surgeon makes small incisions. A thin tube with a tiny video camera (called a laparoscope) is put into the abdomen. The surgeon repairs the hernia with mesh by looking with the video camera and using two long instruments. HOME CARE INSTRUCTIONS  After surgery to repair an inguinal hernia: You will need to take pain medicine prescribed by your healthcare provider. Follow all directions carefully. You will need to take care of the wound from the incision. Your activity will be restricted for awhile. This will probably include no heavy lifting for several weeks. You also should not do anything too active for a few weeks. When you can return to work will depend on the type of job that you have. During "watchful waiting" periods, you should: Maintain a healthy weight. Eat a diet high in fiber (fruits, vegetables and whole grains). Drink plenty of fluids to avoid constipation. This means drinking enough water and other liquids to keep your urine clear or pale yellow. Do not lift heavy objects. Do not stand for long periods of time. Quit smoking. This should keep you from developing a frequent cough. SEEK MEDICAL CARE IF:  A bulge develops in your groin area. You feel pain, a burning sensation or pressure in the groin. This might be worse if you are lifting or straining. You develop a fever of more than 100.5 F (38.1 C). SEEK  IMMEDIATE MEDICAL CARE IF:  Pain in the groin increases suddenly. A bulge in the groin gets bigger suddenly and does not go down. For men, there is sudden pain in the scrotum. Or, the size of the scrotum increases. A bulge in the groin area becomes red or purple and is painful to touch. You have nausea or vomiting that does not go away. You feel your heart beating much faster than normal. You cannot have a bowel movement or pass gas. You develop a fever of more than 102.0 F (38.9 C).   This information is not intended to replace advice given to you by your health care provider. Make sure you discuss any questions you have with your health care provider.   Document Released: 08/06/2008 Document Revised: 06/12/2011 Document Reviewed: 09/21/2014 Elsevier Interactive Patient Education [employer]

## 2023-04-19 NOTE — Telephone Encounter (Signed)
Patient has been advised of Pre-Admission date/time, and Surgery date at [facility].  Surgery Date: 04/25/23 Preadmission Testing Date: 04/24/23 (phone 8a-1p)  Patient has been made aware to call [phone], between 1-3:00pm the day before surgery, to find out what time to arrive for surgery.

## 2023-04-23 ENCOUNTER — Telehealth: Payer: Self-pay | Admitting: Surgery

## 2023-04-23 NOTE — Telephone Encounter (Signed)
Patient calls today to cancel his surgery with Dr. Claudine Mouton for 04/25/23.  Patient's insurance does not allow any coverage for surgery at all.  Patient states that hopefully within a year, he can reschedule once he finds a different insurance for coverage.  Patient will need return appointment with Dr. Claudine Mouton prior to rescheduling of future surgery.

## 2023-04-24 ENCOUNTER — Inpatient Hospital Stay: Admission: RE | Admit: 2023-04-24 | Payer: PRIVATE HEALTH INSURANCE | Source: Ambulatory Visit

## 2023-04-25 ENCOUNTER — Ambulatory Visit: Admission: RE | Admit: 2023-04-25 | Payer: PRIVATE HEALTH INSURANCE | Source: Home / Self Care | Admitting: Surgery

## 2023-04-25 ENCOUNTER — Encounter: Payer: Self-pay | Admitting: Urgent Care

## 2023-04-25 ENCOUNTER — Encounter: Admission: RE | Payer: Self-pay | Source: Home / Self Care

## 2023-04-25 SURGERY — XI ROBOTIC ASSISTED INGUINAL HERNIA
Anesthesia: General | Laterality: Right

## 2023-12-24 NOTE — Progress Notes (Unsigned)
 Patient ID: Kevin Bullock, male   DOB: 28-Nov-1995, 28 y.o.   MRN: [id number]  Chief Complaint: Right inguinal hernia for >1 year  History of Present Illness Kevin Bullock is a 28 y.o. male with over a 1+ year history of a right groin bulge with associated pain/discomfort.  Previously seen earlier this year, but deferred due to insurance coverage issues.  He denies any nausea, vomiting, fevers or chills.  His bowel habits have been unchanged/unaltered by the presence of the hernia.  He reports he does not do any heavy lifting at his work, and has not readily exacerbated with activity.  He reports it is worse when laying down, he typically lays on his right or left side.  He reports the mass has been difficult to hide.  He also reports to me some issues with his tonsils and breathing, especially at night when he is awakened with coughing or shortness of breath which prevents immediate return to sleep. He reports that he has been noted to be a heavy/remarkable person who snores, with periods of interrupted respiration.  Past Medical History Past Medical History:  Diagnosis Date   Anxiety    Asthma       No past surgical history on file.  No Known Allergies  No current outpatient medications on file.   No current facility-administered medications for this visit.    Family History Family History  Problem Relation Age of Onset   Healthy Mother    Healthy Father       Social History Social History   Tobacco Use   Smoking status: Every Day    Current packs/day: 0.50    Types: Cigars, Cigarettes    Passive exposure: Past   Smokeless tobacco: Never  Vaping Use   Vaping status: Never Used  Substance Use Topics   Alcohol use: Yes   Drug use: Never    Comment: THC edible tonight        Review of Systems  Constitutional: Negative.   HENT: Negative.    Eyes: Negative.   Respiratory: Negative.    Cardiovascular: Negative.   Gastrointestinal: Negative.   Genitourinary:  Negative.   Skin: Negative.   Neurological: Negative.   Psychiatric/Behavioral: Negative.       Physical Exam Blood pressure (!) 154/91, pulse 86, height 5' 7 (1.702 m), weight 193 lb (87.5 kg), SpO2 98%. Last Weight  Most recent update: 12/25/2023  9:03 AM    Weight  87.5 kg (193 lb)              CONSTITUTIONAL: Well developed, and nourished, appropriately responsive and aware without distress.   EYES: Sclera non-icteric.   EARS, NOSE, MOUTH AND THROAT:  The oropharynx is clear. Oral mucosa is pink and moist.    Hearing is intact to voice.  NECK: Trachea is midline, and there is no jugular venous distension.  LYMPH NODES:  Lymph nodes in the neck are not appreciated. RESPIRATORY:  Lungs are clear, and breath sounds are equal bilaterally.  Normal respiratory effort without pathologic use of accessory muscles. CARDIOVASCULAR: Heart is regular in rate and rhythm.   Well perfused.  GI: The abdomen is  soft, nontender, and nondistended. There were no palpable masses.  I did not appreciate hepatosplenomegaly.  GU: Large right groin mass readily appreciable in supine position.  It is reducible, with minimal discomfort.  Readily recurs upon patient doing partial crunch maneuver.  Difficult to appreciate/evaluate left groin.  Difficult to palpate right testicle. MUSCULOSKELETAL:  Symmetrical  muscle tone appreciated in all four extremities.    SKIN: Skin turgor is normal. No pathologic skin lesions appreciated.  NEUROLOGIC:  Motor and sensation appear grossly normal.  Cranial nerves are grossly without defect. PSYCH:  Alert and oriented to person, place and time. Affect is appropriate for situation.  Data Reviewed I have personally reviewed what is currently available of the patient's imaging, recent labs and medical records.   Labs:     Latest Ref Rng & Units 07/28/2019   11:36 AM 08/12/2017    9:04 PM  CBC  WBC 4.0 - 10.5 K/uL 8.6  4.8   Hemoglobin 13.0 - 17.0 g/dL 83.2  84.3    Hematocrit 39.0 - 52.0 % 48.3  45.7   Platelets 150 - 400 K/uL 195  177       Latest Ref Rng & Units 07/28/2019   11:36 AM 08/12/2017    9:04 PM  CMP  Glucose 70 - 99 mg/dL 869  899   BUN 6 - 20 mg/dL 14  12   Creatinine 9.38 - 1.24 mg/dL 8.95  8.87   Sodium 864 - 145 mmol/L 137  136   Potassium 3.5 - 5.1 mmol/L 4.1  3.5   Chloride 98 - 111 mmol/L 104  103   CO2 22 - 32 mmol/L 23  26   Calcium 8.9 - 10.3 mg/dL 9.6  9.6   Total Protein 6.5 - 8.1 g/dL 8.0  7.6   Total Bilirubin 0.3 - 1.2 mg/dL 0.6  0.5   Alkaline Phos 38 - 126 U/L 80  68   AST 15 - 41 U/L 46  36   ALT 0 - 44 U/L 65  35     Imaging: Radiological images reviewed:   Within last 24 hrs: No results found.  Assessment    Patient Active Problem List   Diagnosis Date Noted   Right inguinal hernia 04/19/2023    Plan    Robotic repair of right inguinal hernia, possible bilateral.  I believe we can aim for October 17 per his desire for a Friday surgery, however I would like to have my suspicion that he is suffering from obstructive sleep apnea addressed prior to undergoing an elective general anesthetic.  I discussed possibility of incarceration, strangulation, enlargement in size over time, and the need for emergency surgery in the face of these.  Also reviewed the techniques of reduction should incarceration occur, and when unsuccessful to present to the ED.  Also discussed that surgery risks include recurrence which can be up to 30% in the case of complex hernias, use of prosthetic materials (mesh) and the increased risk of infection and the possible need for re-operation and removal of mesh, possibility of post-op SBO or ileus, and the risks of general anesthetic including heart attack, stroke, sudden death or some reaction to anesthetic medications. The patient, and those present, appear to understand the risks, any and all questions were answered to the patient's satisfaction.  No guarantees were ever expressed or  implied.    I personally spent a total of 45 minutes in the care of the patient today including preparing to see the patient, getting/reviewing separately obtained history, performing a medically appropriate exam/evaluation, counseling and educating, placing orders, referring and communicating with other health care professionals, documenting clinical information in the EHR, and coordinating care.   These notes generated with voice recognition software. I apologize for typographical errors.  Honor Leghorn M.D., FACS 12/25/2023, 10:26 AM

## 2023-12-25 ENCOUNTER — Ambulatory Visit (INDEPENDENT_AMBULATORY_CARE_PROVIDER_SITE_OTHER): Admitting: Surgery

## 2023-12-25 ENCOUNTER — Encounter: Payer: Self-pay | Admitting: Surgery

## 2023-12-25 VITALS — BP 154/91 | HR 86 | Ht 67.0 in | Wt 193.0 lb

## 2023-12-25 DIAGNOSIS — K4090 Unilateral inguinal hernia, without obstruction or gangrene, not specified as recurrent: Secondary | ICD-10-CM | POA: Diagnosis not present

## 2023-12-25 NOTE — Patient Instructions (Signed)
 We will call you about seeing primary care for your sleep apnea.  We will anticipate doing your surgery on October 17th but this may need to be rescheduled.    You have chose to have your hernia repaired. This will be done by Dr. Lane at [facility].  Please see your (blue) Pre-care information that you have been given today. Our surgery scheduler will call you to verify surgery date and to go over information.   You will need to arrange to be out of work for approximately 1-2 weeks and then you may return with a lifting restriction for 4 more weeks. If you have FMLA or Disability paperwork that needs to be filled out, please have your company fax your paperwork to [phone] or you may drop this by either office. This paperwork will be filled out within 3 days after your surgery has been completed.  You may have a bruise in your groin and also swelling and brusing in your testicle area. You may use ice 4-5 times daily for 15-20 minutes each time. Make sure that you place a barrier between you and the ice pack. To decrease the swelling, you may roll up a bath towel and place it vertically in between your thighs with your testicles resting on the towel. You will want to keep this area elevated as much as possible for several days following surgery.    Inguinal Hernia, Adult Muscles help keep everything in the body in its proper place. But if a weak spot in the muscles develops, something can poke through. That is called a hernia. When this happens in the lower part of the belly (abdomen), it is called an inguinal hernia. (It takes its name from a part of the body in this region called the inguinal canal.) A weak spot in the wall of muscles lets some fat or part of the small intestine bulge through. An inguinal hernia can develop at any age. Men get them more often than women. CAUSES  In adults, an inguinal hernia develops over time. It can be triggered by: Suddenly straining the muscles  of the lower abdomen. Lifting heavy objects. Straining to have a bowel movement. Difficult bowel movements (constipation) can lead to this. Constant coughing. This may be caused by smoking or lung disease. Being overweight. Being pregnant. Working at a job that requires long periods of standing or heavy lifting. Having had an inguinal hernia before. One type can be an emergency situation. It is called a strangulated inguinal hernia. It develops if part of the small intestine slips through the weak spot and cannot get back into the abdomen. The blood supply can be cut off. If that happens, part of the intestine may die. This situation requires emergency surgery. SYMPTOMS  Often, a small inguinal hernia has no symptoms. It is found when a healthcare provider does a physical exam. Larger hernias usually have symptoms.  In adults, symptoms may include: A lump in the groin. This is easier to see when the person is standing. It might disappear when lying down. In men, a lump in the scrotum. Pain or burning in the groin. This occurs especially when lifting, straining or coughing. A dull ache or feeling of pressure in the groin. Signs of a strangulated hernia can include: A bulge in the groin that becomes very painful and tender to the touch. A bulge that turns red or purple. Fever, nausea and vomiting. Inability to have a bowel movement or to pass gas. DIAGNOSIS  To decide if you have an inguinal hernia, a healthcare provider will probably do a physical examination. This will include asking questions about any symptoms you have noticed. The healthcare provider might feel the groin area and ask you to cough. If an inguinal hernia is felt, the healthcare provider may try to slide it back into the abdomen. Usually no other tests are needed. TREATMENT  Treatments can vary. The size of the hernia makes a difference. Options include: Watchful waiting. This is often suggested if the hernia is small and  you have had no symptoms. No medical procedure will be done unless symptoms develop. You will need to watch closely for symptoms. If any occur, contact your healthcare provider right away. Surgery. This is used if the hernia is larger or you have symptoms. Open surgery. This is usually an outpatient procedure (you will not stay overnight in a hospital). An cut (incision) is made through the skin in the groin. The hernia is put back inside the abdomen. The weak area in the muscles is then repaired by herniorrhaphy or hernioplasty. Herniorrhaphy: in this type of surgery, the weak muscles are sewn back together. Hernioplasty: a patch or mesh is used to close the weak area in the abdominal wall. Laparoscopy. In this procedure, a surgeon makes small incisions. A thin tube with a tiny video camera (called a laparoscope) is put into the abdomen. The surgeon repairs the hernia with mesh by looking with the video camera and using two long instruments. HOME CARE INSTRUCTIONS  After surgery to repair an inguinal hernia: You will need to take pain medicine prescribed by your healthcare provider. Follow all directions carefully. You will need to take care of the wound from the incision. Your activity will be restricted for awhile. This will probably include no heavy lifting for several weeks. You also should not do anything too active for a few weeks. When you can return to work will depend on the type of job that you have. During watchful waiting periods, you should: Maintain a healthy weight. Eat a diet high in fiber (fruits, vegetables and whole grains). Drink plenty of fluids to avoid constipation. This means drinking enough water and other liquids to keep your urine clear or pale yellow. Do not lift heavy objects. Do not stand for long periods of time. Quit smoking. This should keep you from developing a frequent cough. SEEK MEDICAL CARE IF:  A bulge develops in your groin area. You feel pain, a  burning sensation or pressure in the groin. This might be worse if you are lifting or straining. You develop a fever of more than 100.5 F (38.1 C). SEEK IMMEDIATE MEDICAL CARE IF:  Pain in the groin increases suddenly. A bulge in the groin gets bigger suddenly and does not go down. For men, there is sudden pain in the scrotum. Or, the size of the scrotum increases. A bulge in the groin area becomes red or purple and is painful to touch. You have nausea or vomiting that does not go away. You feel your heart beating much faster than normal. You cannot have a bowel movement or pass gas. You develop a fever of more than 102.0 F (38.9 C).   This information is not intended to replace advice given to you by your health care provider. Make sure you discuss any questions you have with your health care provider.   Document Released: 08/06/2008 Document Revised: 06/12/2011 Document Reviewed: 09/21/2014 Elsevier Interactive Patient Education [employer]

## 2024-01-02 ENCOUNTER — Encounter: Payer: Self-pay | Admitting: Internal Medicine

## 2024-01-02 ENCOUNTER — Other Ambulatory Visit: Payer: Self-pay

## 2024-01-02 ENCOUNTER — Ambulatory Visit (INDEPENDENT_AMBULATORY_CARE_PROVIDER_SITE_OTHER): Admitting: Internal Medicine

## 2024-01-02 ENCOUNTER — Telehealth: Payer: Self-pay

## 2024-01-02 VITALS — BP 124/82 | HR 83 | Temp 98.0°F | Resp 16 | Ht 67.0 in | Wt 195.0 lb

## 2024-01-02 DIAGNOSIS — Z13220 Encounter for screening for lipoid disorders: Secondary | ICD-10-CM

## 2024-01-02 DIAGNOSIS — J4520 Mild intermittent asthma, uncomplicated: Secondary | ICD-10-CM

## 2024-01-02 DIAGNOSIS — Z1159 Encounter for screening for other viral diseases: Secondary | ICD-10-CM

## 2024-01-02 DIAGNOSIS — Z114 Encounter for screening for human immunodeficiency virus [HIV]: Secondary | ICD-10-CM

## 2024-01-02 DIAGNOSIS — R5383 Other fatigue: Secondary | ICD-10-CM | POA: Diagnosis not present

## 2024-01-02 DIAGNOSIS — R0683 Snoring: Secondary | ICD-10-CM

## 2024-01-02 DIAGNOSIS — F419 Anxiety disorder, unspecified: Secondary | ICD-10-CM

## 2024-01-02 DIAGNOSIS — F1090 Alcohol use, unspecified, uncomplicated: Secondary | ICD-10-CM

## 2024-01-02 MED ORDER — ALBUTEROL SULFATE HFA 108 (90 BASE) MCG/ACT IN AERS: 2.0000 | INHALATION_SPRAY | Freq: Four times a day (QID) | RESPIRATORY_TRACT | 2 refills | Status: DC | PRN

## 2024-01-02 NOTE — Telephone Encounter (Signed)
 Copied from CRM #[id number]. Topic: Clinical - Medical Advice >> Jan 02, 2024  2:07 PM Nathanel BROCKS wrote: Reason for CRM: pt received a mychart letter that stated that he needs to schedule a pulmonary visit. He said that he didn't know what this was and can someone call him and advise if he needs to do this.

## 2024-01-02 NOTE — Progress Notes (Signed)
 New Patient Office Visit  Subjective    Patient ID: Kevin Bullock, male    DOB: May 06, 1995  Age: 28 y.o. MRN: [id number]  CC:  Chief Complaint  Patient presents with   Establish Care   Obstructive Sleep Apnea    Per Per. Dr.Rodenburg at Surgical office    HPI Kevin Bullock presents to establish care.  Discussed the use of AI scribe software for clinical note transcription with the patient, who gave verbal consent to proceed.  History of Present Illness Kevin Bullock is a 28 year old male who presents with sleep disturbances and for pre-operative evaluation for hernia surgery.  He experiences significant sleep disturbances with snoring and waking up gasping for air three to five times per night. These episodes have worsened, causing difficulty in breathing. He sometimes wakes up feeling tired and occasionally has morning headaches. Excessive daytime sleepiness is present, requiring frequent naps earlier this year.  He has asthma diagnosed in childhood but no current breathing issues except occasional shortness of breath, attributed to being overweight. He experiences wheezing sometimes but has not had recent exacerbations requiring medication.  He has anxiety but does not take medication for it, as past medications worsened symptoms. He uses alcohol as a coping mechanism, drinking wine daily after 9 PM. He acknowledges past heavier alcohol use but has reduced his intake.   Asthma:  -Asthma status: stable -Current Treatments: Nothing -Dyspnea frequency: occasional  -Wheezing frequency: occasional  -Cough frequency: none -Limitation of activity: no -Current upper respiratory symptoms: no -Visits to ER or Urgent Care in past year: no -Pneumovax: Not up to Date -Influenza: Not up to Date  Anxiety:  -Had been on medication in the past but nothing currently. Does cope with frequent alcohol use.   Snoring:  -Waking up gasping for air 3-5 times a night but getting  worse -Snores every night -Sometimes has headaches in the morning -Had been napping daily, working second shift  Health Maintenance: -Blood work due -Will return for Tdap and flu vaccine prior to surgery    No outpatient encounter medications on file as of 01/02/2024.   No facility-administered encounter medications on file as of 01/02/2024.    Past Medical History:  Diagnosis Date   Anxiety    Asthma     No past surgical history on file.  Family History  Problem Relation Age of Onset   Healthy Mother    Healthy Father     Social History   Socioeconomic History   Marital status: Single    Spouse name: Not on file   Number of children: Not on file   Years of education: Not on file   Highest education level: Not on file  Occupational History   Not on file  Tobacco Use   Smoking status: Every Day    Current packs/day: 0.50    Types: Cigars, Cigarettes    Passive exposure: Past   Smokeless tobacco: Never  Vaping Use   Vaping status: Never Used  Substance and Sexual Activity   Alcohol use: Yes   Drug use: Never    Comment: THC edible tonight   Sexual activity: Yes    Birth control/protection: None  Other Topics Concern   Not on file  Social History Narrative   Not on file   Social Drivers of Health   Financial Resource Strain: Not on file  Food Insecurity: Not on file  Transportation Needs: Not on file  Physical Activity: Not on file  Stress: Not on  file  Social Connections: Unknown (08/15/2021)   Received [facility]   Social Network    Social Network: Not on file  Intimate Partner Violence: Unknown (07/07/2021)   Received from [facility]   HITS    Physically Hurt: Not on file    Insult or Talk Down To: Not on file    Threaten Physical Harm: Not on file    Scream or Curse: Not on file    Review of Systems  Constitutional:  Negative for chills and fever.  Respiratory:  Negative for cough, shortness of breath and wheezing.    Psychiatric/Behavioral:  The patient has insomnia.         Objective    BP 124/82 (Cuff Size: Large)   Pulse 83   Temp 98 F (36.7 C) (Oral)   Resp 16   Ht 5' 7 (1.702 m)   Wt 195 lb (88.5 kg)   SpO2 100%   BMI 30.54 kg/m   Physical Exam Constitutional:      Appearance: Normal appearance.  HENT:     Head: Normocephalic and atraumatic.     Mouth/Throat:     Mouth: Mucous membranes are moist.     Pharynx: Posterior oropharyngeal erythema present.  Eyes:     Extraocular Movements: Extraocular movements intact.     Conjunctiva/sclera: Conjunctivae normal.     Pupils: Pupils are equal, round, and reactive to light.  Neck:     Comments: No thyromegaly Cardiovascular:     Rate and Rhythm: Normal rate and regular rhythm.  Pulmonary:     Effort: Pulmonary effort is normal.     Breath sounds: Normal breath sounds.  Musculoskeletal:     Cervical back: No tenderness.     Right lower leg: No edema.     Left lower leg: No edema.  Lymphadenopathy:     Cervical: No cervical adenopathy.  Skin:    General: Skin is warm and dry.  Neurological:     General: No focal deficit present.     Mental Status: He is alert. Mental status is at baseline.  Psychiatric:        Mood and Affect: Mood normal.        Behavior: Behavior normal.     Last CBC Lab Results  Component Value Date   WBC 8.6 07/28/2019   HGB 16.7 07/28/2019   HCT 48.3 07/28/2019   MCV 86.4 07/28/2019   MCH 29.9 07/28/2019   RDW 12.4 07/28/2019   PLT 195 07/28/2019   Last metabolic panel Lab Results  Component Value Date   GLUCOSE 130 (H) 07/28/2019   NA 137 07/28/2019   K 4.1 07/28/2019   CL 104 07/28/2019   CO2 23 07/28/2019   BUN 14 07/28/2019   CREATININE 1.04 07/28/2019   GFRNONAA >60 07/28/2019   CALCIUM 9.6 07/28/2019   PROT 8.0 07/28/2019   ALBUMIN 4.4 07/28/2019   BILITOT 0.6 07/28/2019   ALKPHOS 80 07/28/2019   AST 46 (H) 07/28/2019   ALT 65 (H) 07/28/2019   ANIONGAP 10 07/28/2019    Last lipids No results found for: CHOL, HDL, LDLCALC, LDLDIRECT, TRIG, CHOLHDL Last hemoglobin A1c No results found for: HGBA1C Last thyroid functions No results found for: TSH, T3TOTAL, T4TOTAL, THYROIDAB Last vitamin D No results found for: 25OHVITD2, 25OHVITD3, VD25OH Last vitamin B12 and Folate No results found for: VITAMINB12, FOLATE      Assessment & Plan:   Assessment & Plan Suspected obstructive sleep apnea Symptoms and high sleepiness  scale score suggest obstructive sleep apnea. Addressing this is crucial before hernia surgery. - Refer to sleep specialist for evaluation and potential sleep study. - Request expedited scheduling for sleep study due to upcoming surgery.  Asthma Experiences occasional wheezing and shortness of breath, particularly with exertion. No current asthma medication use. - Prescribe albuterol rescue inhaler for use as needed for wheezing or shortness of breath. - Instruct to use inhaler every 4-6 hours as needed. - Advise to report if inhaler use becomes frequent for potential adjustment to maintenance therapy.  Anxiety and alcohol use Mild anxiety managed without medication. Daily wine consumption noted. Discussed risks of alcohol use and emphasized reduction. - Discuss risks of daily alcohol consumption and potential habit formation. - Offer support and discuss potential medications for alcohol use if desired in the future.  General Health Maintenance Declined flu vaccine today. Due for tetanus booster and flu vaccine before surgery. - Schedule nurse visit for tetanus booster and flu vaccine prior to surgery.  - CBC w/Diff/Platelet - Comprehensive Metabolic Panel (CMET) - Ambulatory referral to Pulmonology - albuterol (VENTOLIN HFA) 108 (90 Base) MCG/ACT inhaler; Inhale 2 puffs into the lungs every 6 (six) hours as needed for wheezing or shortness of breath.  Dispense: 8 g; Refill: 2 - Lipid Profile -  Hepatitis C Antibody - HIV antibody (with reflex)   Return in about 2 weeks (around 01/16/2024) for 2 weeks nurse's visit for tdap and flu vaccine; 1 year medical visit for physical .   Sharyle Fischer, DO

## 2024-01-03 ENCOUNTER — Telehealth: Payer: Self-pay

## 2024-01-03 ENCOUNTER — Ambulatory Visit: Payer: Self-pay | Admitting: Internal Medicine

## 2024-01-03 DIAGNOSIS — R7989 Other specified abnormal findings of blood chemistry: Secondary | ICD-10-CM

## 2024-01-03 DIAGNOSIS — R739 Hyperglycemia, unspecified: Secondary | ICD-10-CM

## 2024-01-03 NOTE — Telephone Encounter (Signed)
 Copied from CRM [id number]. Topic: General - Other >> Jan 03, 2024 12:55 PM Fonda T wrote: Reason for CRM: Received call from patient, states he is returning call to [place] at office.  Patient is returning call and would like to speak to [facility] regarding missed call.   Can be reached, at [phone], between 2:00-2:15 pm due to work break.  Patient is aware of same day call back.

## 2024-01-03 NOTE — Telephone Encounter (Signed)
 Tried to call patient back to tell him Pulmonary call was for the sleep study. Please call back to schedule

## 2024-01-03 NOTE — Telephone Encounter (Signed)
 Left message for patient to tell him the call he missed from Pulmonary was for his sleep study. Please call that office back to schedule appointment

## 2024-01-04 LAB — LIPID PANEL
Cholesterol: 256 mg/dL — ABNORMAL HIGH (ref <200)
HDL: 41 mg/dL (ref >=40)
LDL Cholesterol (Calc): 176 mg/dL — ABNORMAL HIGH
Non-HDL Cholesterol (Calc): 215 mg/dL — ABNORMAL HIGH (ref <130)
Total CHOL/HDL Ratio: 6.2 (calc) — ABNORMAL HIGH (ref <5.0)
Triglycerides: 232 mg/dL — ABNORMAL HIGH (ref <150)

## 2024-01-04 LAB — COMPREHENSIVE METABOLIC PANEL WITH GFR
AG Ratio: 2 (calc) (ref 1.0–2.5)
ALT: 90 U/L — ABNORMAL HIGH (ref 9–46)
AST: 60 U/L — ABNORMAL HIGH (ref 10–40)
Albumin: 4.6 g/dL (ref 3.6–5.1)
Alkaline phosphatase (APISO): 74 U/L (ref 36–130)
BUN: 7 mg/dL (ref 7–25)
CO2: 26 mmol/L (ref 20–32)
Calcium: 9.8 mg/dL (ref 8.6–10.3)
Chloride: 103 mmol/L (ref 98–110)
Creat: 0.96 mg/dL (ref 0.60–1.24)
Globulin: 2.3 g/dL (ref 1.9–3.7)
Glucose, Bld: 125 mg/dL — ABNORMAL HIGH (ref 65–99)
Potassium: 4.2 mmol/L (ref 3.5–5.3)
Sodium: 137 mmol/L (ref 135–146)
Total Bilirubin: 0.6 mg/dL (ref 0.2–1.2)
Total Protein: 6.9 g/dL (ref 6.1–8.1)
eGFR: 111 mL/min/1.73m2 (ref >=60)

## 2024-01-04 LAB — CBC WITH DIFFERENTIAL/PLATELET
Absolute Lymphocytes: 2323 {cells}/uL (ref 850–3900)
Absolute Monocytes: 488 {cells}/uL (ref 200–950)
Basophils Absolute: 41 {cells}/uL (ref 0–200)
Basophils Relative: 0.9 %
Eosinophils Absolute: 32 {cells}/uL (ref 15–500)
Eosinophils Relative: 0.7 %
HCT: 48.9 % (ref 38.5–50.0)
Hemoglobin: 16.5 g/dL (ref 13.2–17.1)
MCH: 31 pg (ref 27.0–33.0)
MCHC: 33.7 g/dL (ref 32.0–36.0)
MCV: 91.9 fL (ref 80.0–100.0)
MPV: 11.4 fL (ref 7.5–12.5)
Monocytes Relative: 10.6 %
Neutro Abs: 1716 {cells}/uL (ref 1500–7800)
Neutrophils Relative %: 37.3 %
Platelets: 230 Thousand/uL (ref 140–400)
RBC: 5.32 Million/uL (ref 4.20–5.80)
RDW: 13.1 % (ref 11.0–15.0)
Total Lymphocyte: 50.5 %
WBC: 4.6 Thousand/uL (ref 3.8–10.8)

## 2024-01-04 LAB — EXTRA

## 2024-01-04 LAB — HEMOGLOBIN A1C
Hgb A1c MFr Bld: 6.9 % — ABNORMAL HIGH (ref <5.7)
Mean Plasma Glucose: 151 mg/dL
eAG (mmol/L): 8.4 mmol/L

## 2024-01-04 LAB — HIV ANTIBODY (ROUTINE TESTING W REFLEX)
HIV 1&2 Ab, 4th Generation: NONREACTIVE
HIV FINAL INTERPRETATION: NEGATIVE

## 2024-01-04 LAB — HEPATITIS C ANTIBODY: Hepatitis C Ab: NONREACTIVE

## 2024-01-08 ENCOUNTER — Telehealth: Payer: Self-pay | Admitting: Internal Medicine

## 2024-01-08 NOTE — Telephone Encounter (Signed)
 Copied from CRM #[id number]. Topic: General - Call Back - No Documentation >> Jan 08, 2024  2:00 PM Precious C wrote: Reason for CRM: pt called to speak with Sherrilyn in office.SABRA Sherrilyn was assisting other pts and advised she wold call pt back

## 2024-01-08 NOTE — Telephone Encounter (Signed)
 Called patient back and left message.

## 2024-01-08 NOTE — Telephone Encounter (Signed)
 Patient called back for [facility]. I let patient knjow Hlen will call him back

## 2024-01-08 NOTE — Telephone Encounter (Signed)
 Called patient back x 2 left message

## 2024-01-09 ENCOUNTER — Ambulatory Visit (HOSPITAL_COMMUNITY)
Admission: RE | Admit: 2024-01-09 | Discharge: 2024-01-09 | Disposition: A | Discharge status: Home / Self Care | Source: Ambulatory Visit | Attending: Internal Medicine | Admitting: Internal Medicine

## 2024-01-09 DIAGNOSIS — R7989 Other specified abnormal findings of blood chemistry: Secondary | ICD-10-CM | POA: Diagnosis present

## 2024-01-09 NOTE — Telephone Encounter (Signed)
 Called patient again and left message.

## 2024-01-10 ENCOUNTER — Ambulatory Visit: Payer: Self-pay | Admitting: Internal Medicine

## 2024-01-11 ENCOUNTER — Other Ambulatory Visit: Payer: Self-pay

## 2024-01-11 ENCOUNTER — Telehealth: Payer: Self-pay | Admitting: Internal Medicine

## 2024-01-11 MED ORDER — BLOOD GLUCOSE TEST VI STRP: 1.0000 | ORAL_STRIP | Freq: Three times a day (TID) | 0 refills | Status: AC

## 2024-01-11 MED ORDER — BLOOD GLUCOSE MONITORING SUPPL DEVI: 1.0000 | Freq: Three times a day (TID) | 0 refills | Status: AC

## 2024-01-11 MED ORDER — LANCETS MISC. MISC: 1.0000 | Freq: Three times a day (TID) | 0 refills | Status: AC

## 2024-01-11 NOTE — Telephone Encounter (Signed)
 Copied from CRM [id number]. Topic: General - Other >> Jan 11, 2024  2:42 PM Zebedee SAUNDERS wrote: Reason for CRM: Pt would like a call [phone] back regarding the use of his Blood Glucose Monitor. Blood reading is 135.

## 2024-01-14 NOTE — Telephone Encounter (Signed)
 Called patient and he stated he would call me back at another time

## 2024-01-15 ENCOUNTER — Ambulatory Visit: Admitting: Sleep Medicine

## 2024-01-15 ENCOUNTER — Ambulatory Visit

## 2024-01-15 ENCOUNTER — Encounter: Payer: Self-pay | Admitting: Sleep Medicine

## 2024-01-15 VITALS — BP 124/82 | HR 83 | Temp 97.5°F | Ht 67.0 in | Wt 196.8 lb

## 2024-01-15 DIAGNOSIS — Z6830 Body mass index (BMI) 30.0-30.9, adult: Secondary | ICD-10-CM

## 2024-01-15 DIAGNOSIS — E669 Obesity, unspecified: Secondary | ICD-10-CM

## 2024-01-15 DIAGNOSIS — G4733 Obstructive sleep apnea (adult) (pediatric): Secondary | ICD-10-CM

## 2024-01-15 NOTE — Progress Notes (Signed)
 Name:Kevin Bullock MRN: [id number] DOB: 11-25-1995   CHIEF COMPLAINT:  EXCESSIVE DAYTIME SLEEPINESS   HISTORY OF PRESENT ILLNESS: Mr. Kevin Bullock is a 28 y.o. w/ a h/o DMII and obesity who presents for c/o loud snoring, witnessed apnea and excessive daytime sleepiness which has been present for several years. Reports nocturnal awakenings due to gasping or nocturia and has difficulty falling back to sleep. Denies any significant weight changes. Admits to dry mouth, night sweats and morning headaches. Denies RLS symptoms, dream enactment, cataplexy, hypnagogic or hypnapompic hallucinations. Denies a family history of sleep apnea. Denies drowsy driving. Drinks 1-2 cups of coffee daily, denies alcohol or illicit drug use. Smokes 1/2 PPD tobacco use.   Bedtime 12-1 am Sleep onset 5 mins Rise time 7-8 am   EPWORTH SLEEP SCORE 21    01/02/2024    9:00 AM  Results of the Epworth flowsheet  Sitting and reading 3  Watching TV 2  Sitting, inactive in a public place (e.g. a theatre or a meeting) 1  As a passenger in a car for an hour without a break 3  Lying down to rest in the afternoon when circumstances permit 3  Sitting and talking to someone 3  Sitting quietly after a lunch without alcohol 3  In a car, while stopped for a few minutes in traffic 3  Total score 21    PAST MEDICAL HISTORY :   has a past medical history of Anxiety and Asthma.  has no past surgical history on file. Prior to Admission medications   Medication Sig Start Date End Date Taking? Authorizing Provider  Blood Glucose Monitoring Suppl DEVI 1 each by Does not apply route in the morning, at noon, and at bedtime. May substitute to any manufacturer covered by patient's insurance. 01/11/24  Yes Bernardo Fend, DO  Glucose Blood (BLOOD GLUCOSE TEST STRIPS) STRP 1 each by Does not apply route in the morning, at noon, and at bedtime. May substitute to any manufacturer covered by patient's insurance. 01/11/24  02/10/24 Yes Bernardo Fend, DO  Lancets Misc. MISC 1 each by Does not apply route in the morning, at noon, and at bedtime. May substitute to any manufacturer covered by patient's insurance. 01/11/24 02/10/24 Yes Bernardo Fend, DO  albuterol (VENTOLIN HFA) 108 (90 Base) MCG/ACT inhaler Inhale 2 puffs into the lungs every 6 (six) hours as needed for wheezing or shortness of breath. Patient not taking: Reported on 01/15/2024 01/02/24   Bernardo Fend, DO   No Known Allergies  FAMILY HISTORY:  family history includes Healthy in his father and mother. SOCIAL HISTORY:  reports that he has been smoking cigars. He has been exposed to tobacco smoke. He has never used smokeless tobacco. He reports current alcohol use. He reports that he does not use drugs.   Review of Systems:  Gen:  Denies  fever, sweats, chills weight loss  HEENT: Denies blurred vision, double vision, ear pain, eye pain, hearing loss, nose bleeds, sore throat Cardiac:  No dizziness, chest pain or heaviness, chest tightness,edema, No JVD Resp:   No cough, -sputum production, -shortness of breath,-wheezing, -hemoptysis,  Gi: Denies swallowing difficulty, stomach pain, nausea or vomiting, diarrhea, constipation, bowel incontinence Gu:  Denies bladder incontinence, burning urine Ext:   Denies Joint pain, stiffness or swelling Skin: Denies  skin rash, easy bruising or bleeding or hives Endoc:  Denies polyuria, polydipsia , polyphagia or weight change Psych:   Denies depression, insomnia or hallucinations  Other:  All other  systems negative  VITAL SIGNS: There were no vitals taken for this visit.   Physical Examination:   General Appearance: No distress  EYES PERRLA, EOM intact.   NECK Supple, No JVD Pulmonary: normal breath sounds, No wheezing.  CardiovascularNormal S1,S2.  No m/r/g.   Abdomen: Benign, Soft, non-tender. Skin:   warm, no rashes, no ecchymosis  Extremities: normal, no cyanosis,  clubbing. Neuro:without focal findings,  speech normal  PSYCHIATRIC: Mood, affect within normal limits.   ASSESSMENT AND PLAN  OSA I suspect that OSA is likely present due to clinical presentation. Discussed the consequences of untreated sleep apnea. Advised not to drive drowsy for safety of patient and others. Will complete further evaluation with a home sleep study and follow up to review results.    DMII  Stable, on current management. Following with PCP.    MEDICATION ADJUSTMENTS/LABS AND TESTS ORDERED: Recommend Sleep Study   Patient  satisfied with Plan of action and management. All questions answered  Follow up to review HST results and treatment plan.   I spent a total of 32 minutes reviewing chart data, face-to-face evaluation with the patient, counseling and coordination of care as detailed above.    Ezinne Yogi, M.D.  Sleep Medicine [facility]

## 2024-01-15 NOTE — Patient Instructions (Signed)
 Kevin Bullock

## 2024-01-16 ENCOUNTER — Ambulatory Visit (INDEPENDENT_AMBULATORY_CARE_PROVIDER_SITE_OTHER)

## 2024-01-16 DIAGNOSIS — Z23 Encounter for immunization: Secondary | ICD-10-CM | POA: Diagnosis not present

## 2024-01-18 ENCOUNTER — Telehealth: Payer: Self-pay

## 2024-01-18 NOTE — Telephone Encounter (Signed)
 Copied from CRM #[id number]. Topic: Clinical - Lab/Test Results >> Jan 18, 2024 11:48 AM Rilla NOVAK wrote: Reason for CRM: Patient calling for test results.  Please call if you have received results.  Please call patient at [phone].

## 2024-01-18 NOTE — Telephone Encounter (Signed)
 Patient did the [facility] Sleep Study.

## 2024-01-24 DIAGNOSIS — G4733 Obstructive sleep apnea (adult) (pediatric): Secondary | ICD-10-CM | POA: Diagnosis not present

## 2024-01-25 NOTE — Telephone Encounter (Signed)
 Copied from CRM #[id number]. Topic: General - Other >> Jan 25, 2024 12:14 PM Rilla B wrote: Reason for CRM: Patient calling to state he did receive results from sleep study and wants to go over those and see what's next.  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 Patient ask since he is diagnosed with mild sleep apnea is he able to get the CPAP machine. Patient's wants to know which company it will come from, etc.

## 2024-01-25 NOTE — Telephone Encounter (Signed)
 Copied from CRM #[id number]. Topic: Clinical - Lab/Test Results >> Jan 25, 2024  9:29 AM Corean SAUNDERS wrote: Reason for CRM: Patient Is seeking his sleep study results as he completed it a little over 1 week ago.

## 2024-01-28 ENCOUNTER — Ambulatory Visit: Payer: Self-pay

## 2024-01-28 DIAGNOSIS — G4733 Obstructive sleep apnea (adult) (pediatric): Secondary | ICD-10-CM

## 2024-01-28 NOTE — Telephone Encounter (Signed)
 See Result note from 01/28/2024. The patient has been notified.  Nothing further needed.

## 2024-02-27 ENCOUNTER — Ambulatory Visit: Admitting: Surgery

## 2024-03-10 ENCOUNTER — Telehealth: Payer: Self-pay | Admitting: Surgery

## 2024-03-10 ENCOUNTER — Ambulatory Visit: Admitting: Surgery

## 2024-03-10 ENCOUNTER — Encounter: Payer: Self-pay | Admitting: Surgery

## 2024-03-10 VITALS — BP 121/73 | HR 86 | Ht 67.0 in | Wt 179.0 lb

## 2024-03-10 DIAGNOSIS — K4090 Unilateral inguinal hernia, without obstruction or gangrene, not specified as recurrent: Secondary | ICD-10-CM

## 2024-03-10 NOTE — Patient Instructions (Addendum)
 You have chose to have your hernia repaired. This will be done by Dr. Aleen Campi at [facility].  Please see your (blue) Pre-care information that you have been given today. Our surgery scheduler will call you to verify surgery date and to go over information.   You will need to arrange to be out of work for approximately 1-2 weeks and then you may return with a lifting restriction for 4 more weeks. If you have FMLA or Disability paperwork that needs to be filled out, please have your company fax your paperwork to [phone] or you may drop this by either office. This paperwork will be filled out within 3 days after your surgery has been completed.  You may have a bruise in your groin and also swelling and brusing in your testicle area. You may use ice 4-5 times daily for 15-20 minutes each time. Make sure that you place a barrier between you and the ice pack. To decrease the swelling, you may roll up a bath towel and place it vertically in between your thighs with your testicles resting on the towel. You will want to keep this area elevated as much as possible for several days following surgery.    Inguinal Hernia, Adult Muscles help keep everything in the body in its proper place. But if a weak spot in the muscles develops, something can poke through. That is called a hernia. When this happens in the lower part of the belly (abdomen), it is called an inguinal hernia. (It takes its name from a part of the body in this region called the inguinal canal.) A weak spot in the wall of muscles lets some fat or part of the small intestine bulge through. An inguinal hernia can develop at any age. Men get them more often than women. CAUSES  In adults, an inguinal hernia develops over time. It can be triggered by: Suddenly straining the muscles of the lower abdomen. Lifting heavy objects. Straining to have a bowel movement. Difficult bowel movements (constipation) can lead to this. Constant coughing. This  may be caused by smoking or lung disease. Being overweight. Being pregnant. Working at a job that requires long periods of standing or heavy lifting. Having had an inguinal hernia before. One type can be an emergency situation. It is called a strangulated inguinal hernia. It develops if part of the small intestine slips through the weak spot and cannot get back into the abdomen. The blood supply can be cut off. If that happens, part of the intestine may die. This situation requires emergency surgery. SYMPTOMS  Often, a small inguinal hernia has no symptoms. It is found when a healthcare provider does a physical exam. Larger hernias usually have symptoms.  In adults, symptoms may include: A lump in the groin. This is easier to see when the person is standing. It might disappear when lying down. In men, a lump in the scrotum. Pain or burning in the groin. This occurs especially when lifting, straining or coughing. A dull ache or feeling of pressure in the groin. Signs of a strangulated hernia can include: A bulge in the groin that becomes very painful and tender to the touch. A bulge that turns red or purple. Fever, nausea and vomiting. Inability to have a bowel movement or to pass gas. DIAGNOSIS  To decide if you have an inguinal hernia, a healthcare provider will probably do a physical examination. This will include asking questions about any symptoms you have noticed. The healthcare provider might  feel the groin area and ask you to cough. If an inguinal hernia is felt, the healthcare provider may try to slide it back into the abdomen. Usually no other tests are needed. TREATMENT  Treatments can vary. The size of the hernia makes a difference. Options include: Watchful waiting. This is often suggested if the hernia is small and you have had no symptoms. No medical procedure will be done unless symptoms develop. You will need to watch closely for symptoms. If any occur, contact your  healthcare provider right away. Surgery. This is used if the hernia is larger or you have symptoms. Open surgery. This is usually an outpatient procedure (you will not stay overnight in a hospital). An cut (incision) is made through the skin in the groin. The hernia is put back inside the abdomen. The weak area in the muscles is then repaired by herniorrhaphy or hernioplasty. Herniorrhaphy: in this type of surgery, the weak muscles are sewn back together. Hernioplasty: a patch or mesh is used to close the weak area in the abdominal wall. Laparoscopy. In this procedure, a surgeon makes small incisions. A thin tube with a tiny video camera (called a laparoscope) is put into the abdomen. The surgeon repairs the hernia with mesh by looking with the video camera and using two long instruments. HOME CARE INSTRUCTIONS  After surgery to repair an inguinal hernia: You will need to take pain medicine prescribed by your healthcare provider. Follow all directions carefully. You will need to take care of the wound from the incision. Your activity will be restricted for awhile. This will probably include no heavy lifting for several weeks. You also should not do anything too active for a few weeks. When you can return to work will depend on the type of job that you have. During "watchful waiting" periods, you should: Maintain a healthy weight. Eat a diet high in fiber (fruits, vegetables and whole grains). Drink plenty of fluids to avoid constipation. This means drinking enough water and other liquids to keep your urine clear or pale yellow. Do not lift heavy objects. Do not stand for long periods of time. Quit smoking. This should keep you from developing a frequent cough. SEEK MEDICAL CARE IF:  A bulge develops in your groin area. You feel pain, a burning sensation or pressure in the groin. This might be worse if you are lifting or straining. You develop a fever of more than 100.5 F (38.1 C). SEEK  IMMEDIATE MEDICAL CARE IF:  Pain in the groin increases suddenly. A bulge in the groin gets bigger suddenly and does not go down. For men, there is sudden pain in the scrotum. Or, the size of the scrotum increases. A bulge in the groin area becomes red or purple and is painful to touch. You have nausea or vomiting that does not go away. You feel your heart beating much faster than normal. You cannot have a bowel movement or pass gas. You develop a fever of more than 102.0 F (38.9 C).   This information is not intended to replace advice given to you by your health care provider. Make sure you discuss any questions you have with your health care provider.   Document Released: 08/06/2008 Document Revised: 06/12/2011 Document Reviewed: 09/21/2014 Elsevier Interactive Patient Education [employer]

## 2024-03-10 NOTE — Telephone Encounter (Signed)
 Patient has been advised of Pre-Admission date/time, and Surgery date at [facility].  Surgery Date: 04/02/24 Preadmission Testing Date: Preadmissions to call patient.    Patient informed of the scheduling process and surgery information given at time of office visit.  Patient has been made aware to call [phone], between 1-3:00pm the day before surgery, to find out what time to arrive for surgery.

## 2024-03-10 NOTE — Progress Notes (Signed)
 Request for Medical Clearance has been faxed to Dr Bernardo.

## 2024-03-10 NOTE — Progress Notes (Unsigned)
 03/10/2024  History of Present Illness: Kevin Bullock is a 28 y.o. male presenting for follow-up of a right inguinal hernia.  The patient previously had seen Dr. Lane for this.  Initially he had plans for robotic right inguinal hernia repair on 04/25/2023 but the patient canceled due to insurance issues.  She saw Dr. Lane again on 12/25/2023 and at the time he was referred to pulmonology due to possible issues with sleep apnea.  He was evaluated by the pulmonary team and he is currently waiting to pick up a new CPAP machine.  He reports that the right inguinal hernia is relatively stable denies any worsening pain or issues with it.  The hernia remains large all the way down to the scrotum with a significant portion of bowel going through.  It remains reducible as well but the patient reports that as soon as he gets up the hernia starts bulging up again.  Denies any constipation or diarrhea.  Denies any abdominal pain.  Denies any issues in the left groin.  Otherwise he reports that he has been cutting down carbs and sugars.  Past Medical History: Past Medical History:  Diagnosis Date   Anxiety    Asthma      Past Surgical History: History reviewed. No pertinent surgical history.  Home Medications: Prior to Admission medications   Medication Sig Start Date End Date Taking? Authorizing Provider  Blood Glucose Monitoring Suppl DEVI 1 each by Does not apply route in the morning, at noon, and at bedtime. May substitute to any manufacturer covered by patient's insurance. 01/11/24  Yes Bernardo Fend, DO    Allergies: No Known Allergies  Review of Systems: Review of Systems  Constitutional:  Negative for chills and fever.  Respiratory:  Negative for shortness of breath.   Cardiovascular:  Negative for chest pain.  Gastrointestinal:  Positive for abdominal pain (mild discomfort from bulging of hernia). Negative for nausea and vomiting.  Genitourinary:  Negative for dysuria.     Physical Exam BP 121/73   Pulse 86   Ht 5' 7 (1.702 m)   Wt 179 lb (81.2 kg)   SpO2 100%   BMI 28.04 kg/m  CONSTITUTIONAL: No acute distress, well-nourished HEENT:  Normocephalic, atraumatic, extraocular motion intact. RESPIRATORY:  Lungs are clear, and breath sounds are equal bilaterally. Normal respiratory effort without pathologic use of accessory muscles. CARDIOVASCULAR: Heart is regular without murmurs, gallops, or rubs. GI: The abdomen is soft, nondistended, nontender to palpation.  The patient has a large right inguinoscrotal hernia with hernia contents going down all the way to the scrotum.  It is reducible but definitely takes effort due to the amount of contents bulging through.  No hernia palpable in the left groin or at the umbilicus.   NEUROLOGIC:  Motor and sensation is grossly normal.  Cranial nerves are grossly intact. PSYCH:  Alert and oriented to person, place and time. Affect is normal.  Labs/Imaging: Labs from 01/02/2024: Sodium 137, potassium 4.2, chloride 103, CO2 26, BUN 7, creatinine 0.96.  Total bilirubin 0.6, AST 60, ALT 90, alkaline phosphatase 74.  WBC 4.6, hemoglobin 16.5, hematocrit 48.9, platelets 230.  Hemoglobin A1c 6.9.  Assessment and Plan: This is a 28 y.o. male with a large right inguinoscrotal hernia containing bowel.  - Discussed with patient again the findings on exam today.  He does have a large right inguinal scrotal hernia which contains bowel.  This is reducible although with some effort at bedside.  The patient reports that hernia has  been overall stable.  He has been following with his PCP and also saw pulmonary and is waiting to pick up a new CPAP machine.  He feels more ready for surgery now. - Discussed with the patient and plan for robotic assisted right inguinal hernia repair and reviewed the surgery at length with him including the planned incisions, risks of bleeding, infection, injury to surrounding structures, that this would be an  outpatient procedure, the use of mesh, postoperative activity restrictions, possibility of seromas, pain control, and he is willing to proceed. - We will schedule him for surgery on 04/02/2024 to give him more time to get his CPAP machine and get established with it.  In the meantime we will send for medical clearance.  All of his questions have been answered.  I spent 30 minutes dedicated to the care of this patient on the date of this encounter to include pre-visit review of records, face-to-face time with the patient discussing diagnosis and management, and any post-visit coordination of care.   Aloysius Sheree Plant, MD [facility]

## 2024-03-10 NOTE — H&P (View-Only) (Signed)
 " 03/10/2024  History of Present Illness: Kevin Bullock is a 28 y.o. male presenting for follow-up of a right inguinal hernia.  The patient previously had seen Dr. Lane for this.  Initially he had plans for robotic right inguinal hernia repair on 04/25/2023 but the patient canceled due to insurance issues.  She saw Dr. Lane again on 12/25/2023 and at the time he was referred to pulmonology due to possible issues with sleep apnea.  He was evaluated by the pulmonary team and he is currently waiting to pick up a new CPAP machine.  He reports that the right inguinal hernia is relatively stable denies any worsening pain or issues with it.  The hernia remains large all the way down to the scrotum with a significant portion of bowel going through.  It remains reducible as well but the patient reports that as soon as he gets up the hernia starts bulging up again.  Denies any constipation or diarrhea.  Denies any abdominal pain.  Denies any issues in the left groin.  Otherwise he reports that he has been cutting down carbs and sugars.  Past Medical History: Past Medical History:  Diagnosis Date   Anxiety    Asthma      Past Surgical History: History reviewed. No pertinent surgical history.  Home Medications: Prior to Admission medications   Medication Sig Start Date End Date Taking? Authorizing Provider  Blood Glucose Monitoring Suppl DEVI 1 each by Does not apply route in the morning, at noon, and at bedtime. May substitute to any manufacturer covered by patient's insurance. 01/11/24  Yes Bernardo Fend, DO    Allergies: No Known Allergies  Review of Systems: Review of Systems  Constitutional:  Negative for chills and fever.  Respiratory:  Negative for shortness of breath.   Cardiovascular:  Negative for chest pain.  Gastrointestinal:  Positive for abdominal pain (mild discomfort from bulging of hernia). Negative for nausea and vomiting.  Genitourinary:  Negative for dysuria.     Physical Exam BP 121/73   Pulse 86   Ht 5' 7 (1.702 m)   Wt 179 lb (81.2 kg)   SpO2 100%   BMI 28.04 kg/m  CONSTITUTIONAL: No acute distress, well-nourished HEENT:  Normocephalic, atraumatic, extraocular motion intact. RESPIRATORY:  Lungs are clear, and breath sounds are equal bilaterally. Normal respiratory effort without pathologic use of accessory muscles. CARDIOVASCULAR: Heart is regular without murmurs, gallops, or rubs. GI: The abdomen is soft, nondistended, nontender to palpation.  The patient has a large right inguinoscrotal hernia with hernia contents going down all the way to the scrotum.  It is reducible but definitely takes effort due to the amount of contents bulging through.  No hernia palpable in the left groin or at the umbilicus.   NEUROLOGIC:  Motor and sensation is grossly normal.  Cranial nerves are grossly intact. PSYCH:  Alert and oriented to person, place and time. Affect is normal.  Labs/Imaging: Labs from 01/02/2024: Sodium 137, potassium 4.2, chloride 103, CO2 26, BUN 7, creatinine 0.96.  Total bilirubin 0.6, AST 60, ALT 90, alkaline phosphatase 74.  WBC 4.6, hemoglobin 16.5, hematocrit 48.9, platelets 230.  Hemoglobin A1c 6.9.  Assessment and Plan: This is a 28 y.o. male with a large right inguinoscrotal hernia containing bowel.  - Discussed with patient again the findings on exam today.  He does have a large right inguinal scrotal hernia which contains bowel.  This is reducible although with some effort at bedside.  The patient reports that hernia  has been overall stable.  He has been following with his PCP and also saw pulmonary and is waiting to pick up a new CPAP machine.  He feels more ready for surgery now. - Discussed with the patient and plan for robotic assisted right inguinal hernia repair and reviewed the surgery at length with him including the planned incisions, risks of bleeding, infection, injury to surrounding structures, that this would be an  outpatient procedure, the use of mesh, postoperative activity restrictions, possibility of seromas, pain control, and he is willing to proceed. - We will schedule him for surgery on 04/02/2024 to give him more time to get his CPAP machine and get established with it.  In the meantime we will send for medical clearance.  All of his questions have been answered.  I spent 30 minutes dedicated to the care of this patient on the date of this encounter to include pre-visit review of records, face-to-face time with the patient discussing diagnosis and management, and any post-visit coordination of care.   Aloysius Sheree Plant, MD [facility]     "

## 2024-03-17 ENCOUNTER — Ambulatory Visit: Admitting: Internal Medicine

## 2024-03-18 ENCOUNTER — Inpatient Hospital Stay
Admission: RE | Admit: 2024-03-18 | Discharge: 2024-03-18 | Disposition: A | Source: Ambulatory Visit | Attending: Surgery

## 2024-03-18 ENCOUNTER — Other Ambulatory Visit: Payer: Self-pay

## 2024-03-18 DIAGNOSIS — Z01818 Encounter for other preprocedural examination: Secondary | ICD-10-CM | POA: Diagnosis present

## 2024-03-18 NOTE — Patient Instructions (Addendum)
 Your procedure is scheduled on: [facility] 04/02/24 Report to the Registration Desk on the 1st floor of the Medical Mall. To find out your arrival time, please call [phone] between 1PM - 3PM on: TUESDAY 04/01/24 If your arrival time is 6:00 am, do not arrive before that time as the Medical Mall entrance doors do not open until 6:00 am.  REMEMBER: Instructions that are not followed completely may result in serious medical risk, up to and including death; or upon the discretion of your surgeon and anesthesiologist your surgery may need to be rescheduled.  Do not eat food after midnight the night before surgery.  No gum chewing or hard candies.  You may however, drink CLEAR liquids up to 2 hours before you are scheduled to arrive for your surgery. Do not drink anything within 2 hours of your scheduled arrival time.  Clear liquids include: - water  - apple juice without pulp - gatorade (not RED colors) - black coffee or tea (Do NOT add milk or creamers to the coffee or tea) Do NOT drink anything that is not on this list.   One week prior to surgery: Stop Anti-inflammatories (NSAIDS) such as Advil , Aleve, Ibuprofen , Motrin , Naproxen, Naprosyn and Aspirin based products such as Excedrin, Goody's Powder, BC Powder. Stop ANY OVER THE COUNTER supplements until after surgery.  You may however, continue to take Tylenol  if needed for pain up until the day of surgery.  Continue taking all of your other prescription medications up until the day of surgery.  ON THE DAY OF SURGERY ONLY TAKE THESE MEDICATIONS WITH SIPS OF WATER:  NONE  Use inhalers on the day of surgery and bring to the hospital.  No Alcohol for 24 hours before or after surgery.  No Smoking including e-cigarettes for 24 hours before surgery.  No chewable tobacco products for at least 6 hours before surgery.  No nicotine patches on the day of surgery.  Do not use any recreational drugs for at least a week (preferably 2  weeks) before your surgery.  Please be advised that the combination of cocaine and anesthesia may have negative outcomes, up to and including death. If you test positive for cocaine, your surgery will be cancelled.  On the morning of surgery brush your teeth with toothpaste and water, you may rinse your mouth with mouthwash if you wish. Do not swallow any toothpaste or mouthwash.  Use CHG Soap or wipes as directed on instruction sheet.  Do not wear jewelry, make-up, hairpins, clips or nail polish.  For welded (permanent) jewelry: bracelets, anklets, waist bands, etc.  Please have this removed prior to surgery.  If it is not removed, there is a chance that hospital personnel will need to cut it off on the day of surgery.  Do not wear lotions, powders, or perfumes.   Do not shave body hair from the neck down 48 hours before surgery.  Contact lenses, hearing aids and dentures may not be worn into surgery.  Do not bring valuables to the hospital. [facility] is not responsible for any missing/lost belongings or valuables.   Bring your C-PAP to the hospital in case you may have to spend the night.   Notify your doctor if there is any change in your medical condition (cold, fever, infection).  Wear comfortable clothing (specific to your surgery type) to the hospital.  After surgery, you can help prevent lung complications by doing breathing exercises.  Take deep breaths and cough every 1-2 hours. Your doctor  may order a device called an Incentive Spirometer to help you take deep breaths. When coughing or sneezing, hold a pillow firmly against your incision with both hands. This is called splinting. Doing this helps protect your incision. It also decreases belly discomfort.  If you are being discharged the day of surgery, you will not be allowed to drive home. You will need a responsible individual to drive you home and stay with you for 24 hours after surgery.   If you are taking public  transportation, you will need to have a responsible individual with you.  Please call the Pre-admissions Testing Dept. at [phone] if you have any questions about these instructions.  Surgery Visitation Policy:  Patients having surgery or a procedure may have two visitors.  Children under the age of 40 must have an adult with them who is not the patient.  Merchandiser, Retail to address health-related social needs:  [email or web address]                                                                                                             Preparing for Surgery with CHLORHEXIDINE GLUCONATE (CHG) Soap  Chlorhexidine Gluconate (CHG) Soap  o An antiseptic cleaner that kills germs and bonds with the skin to continue killing germs even after washing  o Used for showering the night before surgery and morning of surgery  Before surgery, you can play an important role by reducing the number of germs on your skin.  CHG (Chlorhexidine gluconate) soap is an antiseptic cleanser which kills germs and bonds with the skin to continue killing germs even after washing.  Please do not use if you have an allergy to CHG or antibacterial soaps. If your skin becomes reddened/irritated stop using the CHG.  1. Shower the NIGHT BEFORE SURGERY with CHG soap.  2. If you choose to wash your hair, wash your hair first as usual with your normal shampoo.  3. After shampooing, rinse your hair and body thoroughly to remove the shampoo.  4. Use CHG as you would any other liquid soap. You can apply CHG directly to the skin and wash gently with a clean washcloth.  5. Apply the CHG soap to your body only from the neck down. Do not use on open wounds or open sores. Avoid contact with your eyes, ears, mouth, and genitals (private parts). Wash face and genitals (private parts) with your normal soap.  6. Wash thoroughly, paying special attention to the area where your surgery will be  performed.  7. Thoroughly rinse your body with warm water.  8. Do not shower/wash with your normal soap after using and rinsing off the CHG soap.  9. Do not use lotions, oils, etc., after showering with CHG.  10. Pat yourself dry with a clean towel.  11. Wear clean pajamas to bed the night before surgery.  12. Place clean sheets on your bed the night of your shower and do not sleep with pets.  13. Do not apply any deodorants/lotions/powders.  14. Please wear clean  clothes to the hospital.  15. Remember to brush your teeth with your regular toothpaste.

## 2024-03-20 ENCOUNTER — Ambulatory Visit (INDEPENDENT_AMBULATORY_CARE_PROVIDER_SITE_OTHER): Admitting: Internal Medicine

## 2024-03-20 ENCOUNTER — Encounter: Payer: Self-pay | Admitting: Internal Medicine

## 2024-03-20 ENCOUNTER — Other Ambulatory Visit: Payer: Self-pay

## 2024-03-20 VITALS — BP 124/82 | HR 85 | Temp 98.3°F | Resp 16 | Ht 67.0 in | Wt 183.2 lb

## 2024-03-20 DIAGNOSIS — F1021 Alcohol dependence, in remission: Secondary | ICD-10-CM

## 2024-03-20 DIAGNOSIS — R7989 Other specified abnormal findings of blood chemistry: Secondary | ICD-10-CM | POA: Diagnosis not present

## 2024-03-20 DIAGNOSIS — E1165 Type 2 diabetes mellitus with hyperglycemia: Secondary | ICD-10-CM

## 2024-03-20 DIAGNOSIS — R9431 Abnormal electrocardiogram [ECG] [EKG]: Secondary | ICD-10-CM

## 2024-03-20 DIAGNOSIS — Z01818 Encounter for other preprocedural examination: Secondary | ICD-10-CM | POA: Diagnosis not present

## 2024-03-20 DIAGNOSIS — I951 Orthostatic hypotension: Secondary | ICD-10-CM

## 2024-03-20 DIAGNOSIS — K4090 Unilateral inguinal hernia, without obstruction or gangrene, not specified as recurrent: Secondary | ICD-10-CM | POA: Diagnosis not present

## 2024-03-20 NOTE — Progress Notes (Signed)
 Established Patient Office Visit  Subjective    Patient ID: Kevin Bullock, male    DOB: 02/02/1996  Age: 28 y.o. MRN: [id number]  CC:  Chief Complaint  Patient presents with   Surgical Clearance   Hernia    HPI Kevin Bullock presents for pre-op evaluation.  Discussed the use of AI scribe software for clinical note transcription with the patient, who gave verbal consent to proceed.  History of Present Illness Kevin Bullock is a 28 year old male who presents for preoperative evaluation.  He previously drank wine heavily throughout the day but stopped abruptly and has remained abstinent. He currently feels well.  He has diabetes based on prior elevated A1c, which he attributes to alcohol use. He checks blood sugars at home with recent readings around 90 to 92 mg/dL. He understands A1c cannot yet reflect recent changes.  He has lightheadedness when standing quickly, described as feeling woozy with seeing stars. He has not had falls or syncope.  He smokes cigars and denies other drug use. He has never had surgery and is not aware of family anesthesia complications.   Type of Surgery: robotic inguinal hernia repair with general anesthesia  Date of Surgery: 04/02/24 Doctor Performing Surgery: Dr. Desiderio  Hx of surgical complications: None - no history of prior surgeries Hx of anesthesia complications: No personal or family history of complications from anesthesia.  No Hx of MI/stenting or COPD. Recent diagnosis of type 2 diabetes, A1c in October 6.9% Smokes cigars occasionally, alcohol dependence but states he stopped drinking completely 1 month ago after his last labs. No illicit drug use.  Revised Cardiac Risk Index for major cardiac event (RCRI): pending Pulmonary Risk Score (ARISCAT): pending  EKG: first EKG consistent with ST elevation in leads V2-V5. Patient was starting to have panic symptoms, leads were replaced with EKG showing normal sinus rhythm but still will ST  elevation V2-V3. Third EKG normal sinus rhythm. Patient denies chest pain or SOB.  Labwork: pending        Outpatient Encounter Medications as of 03/20/2024  Medication Sig   Blood Glucose Monitoring Suppl DEVI 1 each by Does not apply route in the morning, at noon, and at bedtime. May substitute to any manufacturer covered by patient's insurance.   No facility-administered encounter medications on file as of 03/20/2024.    Past Medical History:  Diagnosis Date   Anxiety    Asthma     No past surgical history on file.  Family History  Problem Relation Age of Onset   Healthy Mother    Healthy Father     Social History   Socioeconomic History   Marital status: Single    Spouse name: Not on file   Number of children: Not on file   Years of education: Not on file   Highest education level: Not on file  Occupational History   Not on file  Tobacco Use   Smoking status: Every Day    Types: Cigars    Passive exposure: Past   Smokeless tobacco: Never   Tobacco comments:    Smokes 10-15 cigars a day.         Quit smoking cigarettes in 2019.  Vaping Use   Vaping status: Never Used  Substance and Sexual Activity   Alcohol use: Yes   Drug use: Never    Comment: THC edible tonight   Sexual activity: Yes    Birth control/protection: None  Other Topics Concern   Not on file  Social History Narrative   Not on file   Social Drivers of Health   Tobacco Use: High Risk (03/18/2024)   Patient History    Smoking Tobacco Use: Every Day    Smokeless Tobacco Use: Never    Passive Exposure: Past  Financial Resource Strain: Not on file  Food Insecurity: Not on file  Transportation Needs: Not on file  Physical Activity: Not on file  Stress: Not on file  Social Connections: Unknown (08/15/2021)   [facility]   Social Network    Social Network: Not on file  Intimate Partner Violence: Unknown (07/07/2021)   Received from [facility]   HITS    Physically  Hurt: Not on file    Insult or Talk Down To: Not on file    Threaten Physical Harm: Not on file    Scream or Curse: Not on file  Depression (PHQ2-9): Low Risk (01/02/2024)   Depression (PHQ2-9)    PHQ-2 Score: 0  Alcohol Screen: Low Risk (01/02/2024)   Alcohol Screen    Last Alcohol Screening Score (AUDIT): 1  Housing: Not on file  Utilities: Not on file  Health Literacy: Not on file    Review of Systems  Constitutional:  Negative for chills and fever.  Respiratory:  Negative for cough, shortness of breath and wheezing.   Cardiovascular:  Negative for chest pain.        Objective    BP 124/82 (Cuff Size: Large)   Pulse 85   Temp 98.3 F (36.8 C) (Oral)   Resp 16   Ht 5' 7 (1.702 m)   Wt 183 lb 3.2 oz (83.1 kg)   SpO2 99%   BMI 28.69 kg/m   Physical Exam Constitutional:      Appearance: Normal appearance.  HENT:     Head: Normocephalic and atraumatic.  Eyes:     Conjunctiva/sclera: Conjunctivae normal.  Cardiovascular:     Rate and Rhythm: Normal rate and regular rhythm.  Pulmonary:     Effort: Pulmonary effort is normal.     Breath sounds: Normal breath sounds.  Skin:    General: Skin is warm and dry.  Neurological:     General: No focal deficit present.     Mental Status: He is alert. Mental status is at baseline.  Psychiatric:        Mood and Affect: Mood normal.        Behavior: Behavior normal.     Last CBC Lab Results  Component Value Date   WBC 4.6 01/02/2024   HGB 16.5 01/02/2024   HCT 48.9 01/02/2024   MCV 91.9 01/02/2024   MCH 31.0 01/02/2024   RDW 13.1 01/02/2024   PLT 230 01/02/2024   Last metabolic panel Lab Results  Component Value Date   GLUCOSE 125 (H) 01/02/2024   NA 137 01/02/2024   K 4.2 01/02/2024   CL 103 01/02/2024   CO2 26 01/02/2024   BUN 7 01/02/2024   CREATININE 0.96 01/02/2024   GFRNONAA >60 07/28/2019   CALCIUM 9.8 01/02/2024   PROT 6.9 01/02/2024   ALBUMIN 4.4 07/28/2019   BILITOT 0.6 01/02/2024   ALKPHOS  80 07/28/2019   AST 60 (H) 01/02/2024   ALT 90 (H) 01/02/2024   ANIONGAP 10 07/28/2019   Last lipids Lab Results  Component Value Date   CHOL 256 (H) 01/02/2024   HDL 41 01/02/2024   LDLCALC 176 (H) 01/02/2024   TRIG 232 (H) 01/02/2024   CHOLHDL 6.2 (H) 01/02/2024  Last hemoglobin A1c Lab Results  Component Value Date   HGBA1C 6.9 (H) 01/02/2024   Last thyroid functions No results found for: TSH, T3TOTAL, T4TOTAL, THYROIDAB Last vitamin D No results found for: 25OHVITD2, 25OHVITD3, VD25OH Last vitamin B12 and Folate No results found for: VITAMINB12, FOLATE      Assessment & Plan:   Assessment & Plan  Preoperative evaluation for elective surgery/Abnormal EKG Preoperative evaluation required to ensure no complications from surgery. - Ordered preoperative labs including kidney, liver, electrolytes, and blood sugar. - Performed EKG to assess cardiac function. First 2 EKG's with ST elevation despite patient being asymptomatic. Third EKG normal sinus rhythm. Uncertain if this was a lead placement issue or equipment issue. Troponin pending but urgent referral to Cardiology placed for cardiology pre-op evaluation.  - Will send results to surgeon for clearance.  Type 2 diabetes mellitus Previously elevated A1c indicating diabetic range. Recent cessation of alcohol consumption may improve blood sugar levels. - Ordered metabolic panel to assess blood sugar levels. - Will recheck A1c in the spring with a finger stick. - Monitor blood sugar levels with home glucose monitor.  Alcohol dependence, in remission Alcohol dependence in remission following cessation of alcohol consumption. - Encouraged continued abstinence from alcohol. - Monitored liver function tests.  Elevated liver enzymes Likely secondary to alcohol consumption. Improvement expected with cessation of alcohol intake. - Ordered liver function tests as part of preoperative labs.  Orthostatic  hypotension Experiencing lightheadedness upon standing, likely due to orthostatic hypotension. - Advised on slow positional changes to prevent lightheadedness. - Encouraged adequate hydration.  - CBC w/Diff/Platelet - EKG 12-Lead - Comprehensive Metabolic Panel (CMET) - Troponin I - Ambulatory referral to Cardiology  Return in about 6 months (around 09/18/2024).   Sharyle Fischer, DO

## 2024-03-21 ENCOUNTER — Ambulatory Visit: Payer: Self-pay | Admitting: Internal Medicine

## 2024-03-21 LAB — CBC WITH DIFFERENTIAL/PLATELET
Absolute Lymphocytes: 2083 {cells}/uL (ref 850–3900)
Absolute Monocytes: 406 {cells}/uL (ref 200–950)
Basophils Absolute: 29 {cells}/uL (ref 0–200)
Basophils Relative: 0.7 %
Eosinophils Absolute: 49 {cells}/uL (ref 15–500)
Eosinophils Relative: 1.2 %
HCT: 45.9 % (ref 39.4–51.1)
Hemoglobin: 15.3 g/dL (ref 13.2–17.1)
MCH: 29.5 pg (ref 27.0–33.0)
MCHC: 33.3 g/dL (ref 31.6–35.4)
MCV: 88.4 fL (ref 81.4–101.7)
MPV: 12.9 fL — ABNORMAL HIGH (ref 7.5–12.5)
Monocytes Relative: 9.9 %
Neutro Abs: 1533 {cells}/uL (ref 1500–7800)
Neutrophils Relative %: 37.4 %
Platelets: 199 Thousand/uL (ref 140–400)
RBC: 5.19 Million/uL (ref 4.20–5.80)
RDW: 12.4 % (ref 11.0–15.0)
Total Lymphocyte: 50.8 %
WBC: 4.1 Thousand/uL (ref 3.8–10.8)

## 2024-03-21 LAB — COMPREHENSIVE METABOLIC PANEL WITH GFR
AG Ratio: 2 (calc) (ref 1.0–2.5)
ALT: 21 U/L (ref 9–46)
AST: 15 U/L (ref 10–40)
Albumin: 4.5 g/dL (ref 3.6–5.1)
Alkaline phosphatase (APISO): 86 U/L (ref 36–130)
BUN: 8 mg/dL (ref 7–25)
CO2: 26 mmol/L (ref 20–32)
Calcium: 10 mg/dL (ref 8.6–10.3)
Chloride: 104 mmol/L (ref 98–110)
Creat: 0.93 mg/dL (ref 0.60–1.24)
Globulin: 2.3 g/dL (ref 1.9–3.7)
Glucose, Bld: 95 mg/dL (ref 65–99)
Potassium: 4.2 mmol/L (ref 3.5–5.3)
Sodium: 139 mmol/L (ref 135–146)
Total Bilirubin: 0.3 mg/dL (ref 0.2–1.2)
Total Protein: 6.8 g/dL (ref 6.1–8.1)
eGFR: 115 mL/min/1.73m2

## 2024-03-21 LAB — TROPONIN I: Troponin I: 3 ng/L

## 2024-03-26 ENCOUNTER — Ambulatory Visit: Admitting: Cardiology

## 2024-03-30 NOTE — Progress Notes (Deleted)
 NO SHOW

## 2024-03-31 ENCOUNTER — Ambulatory Visit: Admitting: Cardiovascular Disease

## 2024-03-31 DIAGNOSIS — Z01810 Encounter for preprocedural cardiovascular examination: Secondary | ICD-10-CM

## 2024-03-31 DIAGNOSIS — R9431 Abnormal electrocardiogram [ECG] [EKG]: Secondary | ICD-10-CM

## 2024-04-01 MED ORDER — ORAL CARE MOUTH RINSE: 15.0000 mL | Freq: Once | OROMUCOSAL | Status: AC

## 2024-04-01 MED ORDER — CHLORHEXIDINE GLUCONATE CLOTH 2 % EX PADS: 6.0000 | MEDICATED_PAD | Freq: Once | CUTANEOUS | Status: DC

## 2024-04-01 MED ORDER — GABAPENTIN 300 MG PO CAPS
300.0000 mg | ORAL_CAPSULE | ORAL | Status: AC
  Administered 2024-04-02: 300 mg via ORAL

## 2024-04-01 MED ORDER — CHLORHEXIDINE GLUCONATE 0.12 % MT SOLN
15.0000 mL | Freq: Once | OROMUCOSAL | Status: AC
  Administered 2024-04-02: 15 mL via OROMUCOSAL

## 2024-04-01 MED ORDER — BUPIVACAINE LIPOSOME 1.3 % IJ SUSP: 10.0000 mL | Freq: Once | INTRAMUSCULAR | Status: DC

## 2024-04-01 MED ORDER — CEFAZOLIN SODIUM-DEXTROSE 2-4 GM/100ML-% IV SOLN
2.0000 g | INTRAVENOUS | Status: AC
  Administered 2024-04-02: 2 g via INTRAVENOUS

## 2024-04-01 MED ORDER — LACTATED RINGERS IV SOLN: INTRAVENOUS | Status: DC

## 2024-04-01 MED ORDER — CHLORHEXIDINE GLUCONATE CLOTH 2 % EX PADS
6.0000 | MEDICATED_PAD | Freq: Once | CUTANEOUS | Status: AC
  Administered 2024-04-02: 6 via TOPICAL

## 2024-04-01 MED ORDER — ACETAMINOPHEN 500 MG PO TABS
1000.0000 mg | ORAL_TABLET | ORAL | Status: AC
  Administered 2024-04-02: 1000 mg via ORAL

## 2024-04-02 ENCOUNTER — Ambulatory Visit: Payer: Self-pay | Admitting: Urgent Care

## 2024-04-02 ENCOUNTER — Ambulatory Visit
Admission: RE | Admit: 2024-04-02 | Discharge: 2024-04-02 | Disposition: A | Discharge status: Home / Self Care | Source: Home / Self Care | Attending: Surgery | Admitting: Surgery

## 2024-04-02 ENCOUNTER — Encounter
Admission: RE | Disposition: A | Discharge status: Home / Self Care | Payer: Self-pay | Source: Home / Self Care | Attending: Surgery

## 2024-04-02 ENCOUNTER — Other Ambulatory Visit: Payer: Self-pay

## 2024-04-02 ENCOUNTER — Ambulatory Visit: Admitting: Anesthesiology

## 2024-04-02 ENCOUNTER — Encounter: Payer: Self-pay | Admitting: Surgery

## 2024-04-02 DIAGNOSIS — J45909 Unspecified asthma, uncomplicated: Secondary | ICD-10-CM | POA: Diagnosis not present

## 2024-04-02 DIAGNOSIS — K4090 Unilateral inguinal hernia, without obstruction or gangrene, not specified as recurrent: Secondary | ICD-10-CM | POA: Diagnosis present

## 2024-04-02 HISTORY — PX: HERNIORRHAPHY, INGUINAL, ROBOT-ASSISTED, LAPAROSCOPIC: SHX7585

## 2024-04-02 HISTORY — PX: INSERTION OF MESH: SHX5868

## 2024-04-02 SURGERY — HERNIORRHAPHY, INGUINAL, ROBOT-ASSISTED, LAPAROSCOPIC
Anesthesia: General | Laterality: Right

## 2024-04-02 MED ORDER — HYDROMORPHONE HCL 1 MG/ML IJ SOLN: 0.2500 mg | INTRAMUSCULAR | Status: DC | PRN

## 2024-04-02 MED ORDER — CEFAZOLIN SODIUM-DEXTROSE 2-4 GM/100ML-% IV SOLN
INTRAVENOUS | Status: AC
  Filled 2024-04-02: qty 100

## 2024-04-02 MED ORDER — BUPIVACAINE LIPOSOME 1.3 % IJ SUSP
INTRAMUSCULAR | Status: AC
  Filled 2024-04-02: qty 10

## 2024-04-02 MED ORDER — LIDOCAINE HCL (CARDIAC) PF 100 MG/5ML IV SOSY
PREFILLED_SYRINGE | INTRAVENOUS | Status: DC | PRN
  Administered 2024-04-02: 80 mg via INTRAVENOUS

## 2024-04-02 MED ORDER — FENTANYL CITRATE (PF) 100 MCG/2ML IJ SOLN
INTRAMUSCULAR | Status: AC
  Filled 2024-04-02: qty 2

## 2024-04-02 MED ORDER — DEXAMETHASONE SOD PHOSPHATE PF 10 MG/ML IJ SOLN
INTRAMUSCULAR | Status: DC | PRN
  Administered 2024-04-02: 10 mg via INTRAVENOUS

## 2024-04-02 MED ORDER — OXYCODONE HCL 5 MG PO TABS
5.0000 mg | ORAL_TABLET | Freq: Once | ORAL | Status: AC | PRN
  Administered 2024-04-02: 5 mg via ORAL

## 2024-04-02 MED ORDER — EPHEDRINE SULFATE-NACL 50-0.9 MG/10ML-% IV SOSY
PREFILLED_SYRINGE | INTRAVENOUS | Status: DC | PRN
  Administered 2024-04-02: 10 mg via INTRAVENOUS

## 2024-04-02 MED ORDER — SUGAMMADEX SODIUM 200 MG/2ML IV SOLN
INTRAVENOUS | Status: DC | PRN
  Administered 2024-04-02: 200 mg via INTRAVENOUS

## 2024-04-02 MED ORDER — IBUPROFEN 600 MG PO TABS: 600.0000 mg | ORAL_TABLET | Freq: Three times a day (TID) | ORAL | 1 refills | Status: AC | PRN

## 2024-04-02 MED ORDER — MIDAZOLAM HCL 2 MG/2ML IJ SOLN
INTRAMUSCULAR | Status: AC
  Filled 2024-04-02: qty 2

## 2024-04-02 MED ORDER — BUPIVACAINE-EPINEPHRINE (PF) 0.5% -1:200000 IJ SOLN
INTRAMUSCULAR | Status: AC
  Filled 2024-04-02: qty 30

## 2024-04-02 MED ORDER — DEXMEDETOMIDINE HCL IN NACL 80 MCG/20ML IV SOLN
INTRAVENOUS | Status: DC | PRN
  Administered 2024-04-02: 4 ug via INTRAVENOUS
  Administered 2024-04-02 (×2): 8 ug via INTRAVENOUS

## 2024-04-02 MED ORDER — LACTATED RINGERS IV SOLN: INTRAVENOUS | Status: DC | PRN

## 2024-04-02 MED ORDER — OXYCODONE HCL 5 MG/5ML PO SOLN: 5.0000 mg | Freq: Once | ORAL | Status: AC | PRN

## 2024-04-02 MED ORDER — CHLORHEXIDINE GLUCONATE 0.12 % MT SOLN
OROMUCOSAL | Status: AC
  Filled 2024-04-02: qty 15

## 2024-04-02 MED ORDER — MIDAZOLAM HCL (PF) 2 MG/2ML IJ SOLN
INTRAMUSCULAR | Status: DC | PRN
  Administered 2024-04-02: 2 mg via INTRAVENOUS

## 2024-04-02 MED ORDER — ACETAMINOPHEN 500 MG PO TABS: 1000.0000 mg | ORAL_TABLET | Freq: Four times a day (QID) | ORAL | Status: AC | PRN

## 2024-04-02 MED ORDER — GABAPENTIN 300 MG PO CAPS
ORAL_CAPSULE | ORAL | Status: AC
  Filled 2024-04-02: qty 1

## 2024-04-02 MED ORDER — OXYCODONE HCL 5 MG PO TABS: 5.0000 mg | ORAL_TABLET | ORAL | 0 refills | Status: AC | PRN

## 2024-04-02 MED ORDER — KETOROLAC TROMETHAMINE 30 MG/ML IJ SOLN
INTRAMUSCULAR | Status: AC
  Filled 2024-04-02: qty 1

## 2024-04-02 MED ORDER — EPHEDRINE 5 MG/ML INJ
INTRAVENOUS | Status: AC
  Filled 2024-04-02: qty 5

## 2024-04-02 MED ORDER — ONDANSETRON HCL 4 MG/2ML IJ SOLN
INTRAMUSCULAR | Status: DC | PRN
  Administered 2024-04-02: 4 mg via INTRAVENOUS

## 2024-04-02 MED ORDER — PROPOFOL 10 MG/ML IV BOLUS
INTRAVENOUS | Status: DC | PRN
  Administered 2024-04-02: 200 mg via INTRAVENOUS

## 2024-04-02 MED ORDER — FENTANYL CITRATE (PF) 100 MCG/2ML IJ SOLN
INTRAMUSCULAR | Status: DC | PRN
  Administered 2024-04-02 (×2): 50 ug via INTRAVENOUS
  Administered 2024-04-02: 25 ug via INTRAVENOUS
  Administered 2024-04-02: 50 ug via INTRAVENOUS

## 2024-04-02 MED ORDER — ROCURONIUM BROMIDE 10 MG/ML (PF) SYRINGE
PREFILLED_SYRINGE | INTRAVENOUS | Status: AC
  Filled 2024-04-02: qty 10

## 2024-04-02 MED ORDER — PROPOFOL 10 MG/ML IV BOLUS
INTRAVENOUS | Status: AC
  Filled 2024-04-02: qty 20

## 2024-04-02 MED ORDER — ROCURONIUM BROMIDE 100 MG/10ML IV SOLN
INTRAVENOUS | Status: DC | PRN
  Administered 2024-04-02: 60 mg via INTRAVENOUS
  Administered 2024-04-02 (×2): 20 mg via INTRAVENOUS

## 2024-04-02 MED ORDER — KETOROLAC TROMETHAMINE 30 MG/ML IJ SOLN
INTRAMUSCULAR | Status: DC | PRN
  Administered 2024-04-02: 30 mg via INTRAVENOUS

## 2024-04-02 MED ORDER — ONDANSETRON HCL 4 MG/2ML IJ SOLN
INTRAMUSCULAR | Status: AC
  Filled 2024-04-02: qty 2

## 2024-04-02 MED ORDER — DEXMEDETOMIDINE HCL IN NACL 80 MCG/20ML IV SOLN
INTRAVENOUS | Status: AC
  Filled 2024-04-02: qty 20

## 2024-04-02 MED ORDER — BUPIVACAINE-EPINEPHRINE 0.5% -1:200000 IJ SOLN
INTRAMUSCULAR | Status: DC | PRN
  Administered 2024-04-02: 50 mL via INTRAMUSCULAR

## 2024-04-02 MED ORDER — LIDOCAINE HCL (PF) 2 % IJ SOLN
INTRAMUSCULAR | Status: AC
  Filled 2024-04-02: qty 5

## 2024-04-02 MED ORDER — OXYCODONE HCL 5 MG PO TABS
ORAL_TABLET | ORAL | Status: AC
  Filled 2024-04-02: qty 1

## 2024-04-02 MED ORDER — ACETAMINOPHEN 500 MG PO TABS
ORAL_TABLET | ORAL | Status: AC
  Filled 2024-04-02: qty 2

## 2024-04-02 SURGICAL SUPPLY — 38 items
COVER TIP SHEARS 8 DVNC (MISCELLANEOUS) ×2 IMPLANT
COVER WAND RF STERILE (DRAPES) ×2 IMPLANT
DEFOGGER SCOPE WARM SEASHARP (MISCELLANEOUS) ×2 IMPLANT
DERMABOND ADVANCED .7 DNX12 (GAUZE/BANDAGES/DRESSINGS) ×2 IMPLANT
DRAPE ARM DVNC X/XI (DISPOSABLE) ×6 IMPLANT
DRAPE COLUMN DVNC XI (DISPOSABLE) ×2 IMPLANT
ELECTRODE CAUTERY BLDE TIP 2.5 (TIP) ×2 IMPLANT
ELECTRODE REM PT RTRN 9FT ADLT (ELECTROSURGICAL) ×2 IMPLANT
FORCEPS BPLR R/ABLATION 8 DVNC (INSTRUMENTS) ×2 IMPLANT
GLOVE SURG SYN 7.0 PF PI (GLOVE) ×4 IMPLANT
GLOVE SURG SYN 7.5 PF PI (GLOVE) ×4 IMPLANT
GOWN STRL REUS W/ TWL LRG LVL3 (GOWN DISPOSABLE) ×8 IMPLANT
IRRIGATION STRYKERFLOW (MISCELLANEOUS) IMPLANT
IV 0.9% NACL 1000 ML (IV SOLUTION) IMPLANT
IV CATH ANGIO 12GX3 LT BLUE (NEEDLE) IMPLANT
KIT PINK PAD W/HEAD ARM REST (MISCELLANEOUS) ×2 IMPLANT
LABEL OR SOLS (LABEL) ×2 IMPLANT
MANIFOLD NEPTUNE II (INSTRUMENTS) ×2 IMPLANT
MESH 3DMAX MID 4X6 RT LRG (Mesh General) IMPLANT
NEEDLE DRIVE SUT CUT DVNC (INSTRUMENTS) ×2 IMPLANT
NEEDLE HYPO 22X1.5 SAFETY MO (MISCELLANEOUS) ×2 IMPLANT
NEEDLE INSUFFLATION 14GA 120MM (NEEDLE) ×2 IMPLANT
OBTURATOR OPTICALSTD 8 DVNC (TROCAR) ×2 IMPLANT
PACK LAP CHOLECYSTECTOMY (MISCELLANEOUS) ×2 IMPLANT
SCISSORS MNPLR CVD DVNC XI (INSTRUMENTS) ×2 IMPLANT
SEAL UNIV 5-12 XI (MISCELLANEOUS) ×6 IMPLANT
SET TUBE SMOKE EVAC HIGH FLOW (TUBING) ×2 IMPLANT
SOLN STERILE WATER 500 ML (IV SOLUTION) ×2 IMPLANT
SOLUTION ELECTROSURG ANTI STCK (MISCELLANEOUS) ×2 IMPLANT
SPONGE T-LAP 18X18 ~~LOC~~+RFID (SPONGE) ×2 IMPLANT
SUPPORTER AHLETIC TETRA LG (SOFTGOODS) IMPLANT
SUT MNCRL AB 4-0 PS2 18 (SUTURE) ×2 IMPLANT
SUT STRATA 2-0 23CM CT-2 (SUTURE) ×2 IMPLANT
SUT VIC AB 2-0 SH 27XBRD (SUTURE) ×2 IMPLANT
SUT VIC AB 3-0 SH 27X BRD (SUTURE) ×2 IMPLANT
SUT VICRYL 0 UR6 27IN ABS (SUTURE) ×4 IMPLANT
SYSTEM BAG RETRIEVAL 10MM (BASKET) IMPLANT
TRAY FOLEY SLVR 16FR LF STAT (SET/KITS/TRAYS/PACK) ×2 IMPLANT

## 2024-04-02 NOTE — Discharge Instructions (Signed)
 Discharge Instructions: 1.  Patient may shower, but do not scrub wounds heavily and dab dry only. 2.  Do not submerge wounds in pool/tub until fully healed. 3.  Do not apply ointments or hydrogen peroxide to the wounds. 4.  May apply ice packs to the wounds for comfort. 5.  It is normal for there to be bruising and swelling of the scrotum and groin areas.  This will resolve on its own. 6.  Please wear jock strap or tighter underwear to provide scrotal support for the next two weeks. 7.  Do not drive while taking narcotics for pain control.  Prior to driving, make sure you are able to rotate right and left to look at blindspots without significant pain or discomfort. 8.  No heavy lifting or pushing of more than 10-15 lbs for 6 weeks.

## 2024-04-02 NOTE — Anesthesia Procedure Notes (Signed)
 Procedure Name: Intubation Date/Time: 04/02/2024 10:42 AM  Performed by: Myrna Burnard DEL, CRNAPre-anesthesia Checklist: Patient identified, Emergency Drugs available, Suction available, Patient being monitored and Timeout performed Patient Re-evaluated:Patient Re-evaluated prior to induction Oxygen Delivery Method: Circle system utilized Preoxygenation: Pre-oxygenation with 100% oxygen Induction Type: IV induction Ventilation: Mask ventilation without difficulty Laryngoscope Size: McGrath and 4 Grade View: Grade I Tube type: Oral Tube size: 7.0 mm Number of attempts: 1 Airway Equipment and Method: Stylet Placement Confirmation: ETT inserted through vocal cords under direct vision, positive ETCO2, CO2 detector and breath sounds checked- equal and bilateral Secured at: 22 cm Tube secured with: Tape Dental Injury: Teeth and Oropharynx as per pre-operative assessment

## 2024-04-02 NOTE — Op Note (Signed)
 " Procedure Date:  04/02/2024  Pre-operative Diagnosis:  Large right inguinoscrotal hernia  Post-operative Diagnosis: Large right inguinoscrotal hernia  Procedure: 1.  Robotic assisted right Inguinal Hernia Repair 2.  Creation of a right Posterior Rectus-Transversalis Fascia Advancment Flap for Coverage of Pelvic Wound (200 cm)  Surgeon:  Aloysius Sheree Plant, MD  Anesthesia:  General endotracheal  Estimated Blood Loss:  30 ml  Specimens:  None  Complications:  None  Indications for Procedure:  This is a 28 y.o. male who presents with a large right inguinoscrotal hernia.  The options of surgery versus observation were reviewed with the patient and/or family. The risks of bleeding, abscess or infection, recurrence of symptoms, potential for an open procedure, injury to surrounding structures, and chronic pain were all discussed with the patient and he was willing to proceed.  We have planned this transabdominal procedure with the creation of a right peritoneal flap based on the posterior rectus sheath and transversalis fascia in order to fully cover the mesh, creating a natural tisssue barrier for the bowel and peritoneal cavity.  Description of Procedure: The patient was correctly identified in the preoperative area and brought into the operating room.  The patient was placed supine with VTE prophylaxis in place.  Appropriate time-outs were performed.  Anesthesia was induced and the patient was intubated.  Foley catheter was placed.  Appropriate antibiotics were infused.  The patient's inguinal hernia was reduced.  The abdomen was prepped and draped in a sterile fashion. A supraumbilical incision was made. A cutdown technique was used to enter the abdominal cavity without injury, and a Hasson trocar was inserted.  Pneumoperitoneum was obtained with appropriate opening pressures.  A Veress needle was used to start dissecting the peritoneal flap.  Two 8-mm robotic ports were placed in the right  and left lateral positions under direct visualization.  A large right Bard 3D Max Mesh, a 2-0 Vicryl, and 2-0 stratafix suture were placed through the umbilical port under direct visualization.  The [employer] platform was docked onto the patient, the camera was inserted and targeted, and the instruments were placed under direct visualization.  Both inguinal regions were inspected for hernias and it was confirmed that the patient had a large right inguinoscrotal hernia.  Using electocautery, the peritoneal and posterior rectus tissue flap was created.  The peritoneum on the right side was scored from the median umbilical ligament laterally towards the ASIS.  The flap was mobilized using robotic scissors and the bipolar instruments, creating a plane along the posterior rectus sheath and transversalis fascia down to the pubic tubercle medially. It was then further mobilized laterally across the inguinal canal and femoral vessels and onto the psoas muscle. The inferior epigastric vessels were identified and preserved. This created a posterior rectus and peritoneal flap measuring roughly 17 cm x 12 cm.  The patient's hernia sac was very large, extending to the scrotum.  It required tedious dissection to free up the sac, using combination of cautery and blunt dissection.  This took approximately 60 minutes, with careful attention not to create any tears in the tissue, identifying and preserving the cord structures.  A large right Bard 3D Max Mid mesh was placed with good overlap along all the potential hernia defects and secured in place with 2-0 Vicryl along the medial superomedial and superolateral aspects.  Then, the peritoneal flap was advanced over the mesh and carried over to close the defect. A running 2-0 stratafix suture was used to approximate the edge  of the flap onto the peritoneum and to secure the hernia sac to the peritoneum as well. An angiocath was inserted to evacuate some of the gas within the scrotum  and preperitoneal space.  All needles were removed under direct visualization.  The 8- mm ports were removed under direct visualization and the Hasson trocar was removed.  The fascial opening was closed using 0 vicryl suture.  Local anesthetic was infused in all incisions as well as a right ilioinguinal block, and the incisions were closed with 4-0 Monocryl.  The wounds were cleaned and sealed with DermaBond.  Foley catheter was removed and the patient was emerged from anesthesia and extubated and brought to the recovery room for further management.  The patient tolerated the procedure well and all counts were correct at the end of the case.   Aloysius Sheree Plant, MD  "

## 2024-04-02 NOTE — Transfer of Care (Signed)
 Immediate Anesthesia Transfer of Care Note  Patient: Kevin Bullock  Procedure(s) Performed: HERNIORRHAPHY, INGUINAL, ROBOT-ASSISTED, LAPAROSCOPIC (Right) INSERTION OF MESH  Patient Location: PACU  Anesthesia Type:General  Level of Consciousness: sedated  Airway & Oxygen Therapy: Patient Spontanous Breathing  Post-op Assessment: Report given to RN  Post vital signs: Reviewed  Last Vitals:  Vitals Value Taken Time  BP 130/76 04/02/24 13:03  Temp    Pulse 97 04/02/24 13:10  Resp 22 04/02/24 13:10  SpO2 100 % 04/02/24 13:10  Vitals shown include unfiled device data.  Last Pain:  Vitals:   04/02/24 1008  PainSc: 0-No pain         Complications: There were no known notable events for this encounter.

## 2024-04-02 NOTE — Interval H&P Note (Signed)
 History and Physical Interval Note:  04/02/2024 10:08 AM  [facility]  has presented today for surgery, with the diagnosis of right inguinal hernia.  The various methods of treatment have been discussed with the patient and family. After consideration of risks, benefits and other options for treatment, the patient has consented to  Procedures: HERNIORRHAPHY, INGUINAL, ROBOT-ASSISTED, LAPAROSCOPIC (Right) as a surgical intervention.  The patient's history has been reviewed, patient examined, no change in status, stable for surgery.  I have reviewed the patient's chart and labs.  Questions were answered to the patient's satisfaction.     Makenlee Mckeag

## 2024-04-02 NOTE — Anesthesia Postprocedure Evaluation (Signed)
"   Anesthesia Post Note  Patient: Kevin Bullock  Procedure(s) Performed: HERNIORRHAPHY, INGUINAL, ROBOT-ASSISTED, LAPAROSCOPIC (Right) INSERTION OF MESH  Patient location during evaluation: PACU Anesthesia Type: General Level of consciousness: awake and alert Pain management: pain level controlled Vital Signs Assessment: post-procedure vital signs reviewed and stable Respiratory status: spontaneous breathing, nonlabored ventilation, respiratory function stable and patient connected to nasal cannula oxygen Cardiovascular status: blood pressure returned to baseline and stable Postop Assessment: no apparent nausea or vomiting Anesthetic complications: no   There were no known notable events for this encounter.   Last Vitals:  Vitals:   04/02/24 1400 04/02/24 1412  BP: 128/83 128/83  Pulse: 68 73  Resp: 14 15  Temp:  (!) 36.2 C  SpO2: 94% 94%    Last Pain:  Vitals:   04/02/24 1412  PainSc: Asleep                 [name]      "

## 2024-04-02 NOTE — Anesthesia Preprocedure Evaluation (Signed)
"                                    Anesthesia Evaluation  Patient identified by MRN, date of birth, ID band Patient awake    Reviewed: Allergy & Precautions, NPO status , Patient's Chart, lab work & pertinent test results  History of Anesthesia Complications Negative for: history of anesthetic complications  Airway Mallampati: III  TM Distance: >3 FB Neck ROM: full    Dental no notable dental hx.    Pulmonary asthma , Current Smoker and Patient abstained from smoking.   Pulmonary exam normal        Cardiovascular negative cardio ROS Normal cardiovascular exam     Neuro/Psych negative neurological ROS  negative psych ROS   GI/Hepatic negative GI ROS, Neg liver ROS,,,  Endo/Other  negative endocrine ROS    Renal/GU negative Renal ROS  negative genitourinary   Musculoskeletal   Abdominal   Peds  Hematology negative hematology ROS (+)   Anesthesia Other Findings Past Medical History: No date: Anxiety No date: Asthma  History reviewed. No pertinent surgical history.     Reproductive/Obstetrics negative OB ROS                              Anesthesia Physical Anesthesia Plan  ASA: 2  Anesthesia Plan: General ETT   Post-op Pain Management: Toradol  IV (intra-op)*, Ofirmev  IV (intra-op)*, Dilaudid IV and Ketamine IV*   Induction: Intravenous  PONV Risk Score and Plan: 2 and Ondansetron , Dexamethasone, Midazolam and Treatment may vary due to age or medical condition  Airway Management Planned: Oral ETT  Additional Equipment:   Intra-op Plan:   Post-operative Plan: Extubation in OR  Informed Consent: I have reviewed the patients History and Physical, chart, labs and discussed the procedure including the risks, benefits and alternatives for the proposed anesthesia with the patient or authorized representative who has indicated his/her understanding and acceptance.     Dental Advisory Given  Plan Discussed  with: Anesthesiologist, CRNA and Surgeon  Anesthesia Plan Comments: (Patient consented for risks of anesthesia including but not limited to:  - adverse reactions to medications - damage to eyes, teeth, lips or other oral mucosa - nerve damage due to positioning  - sore throat or hoarseness - Damage to heart, brain, nerves, lungs, other parts of body or loss of life  Patient voiced understanding and assent.)        Anesthesia Quick Evaluation  "

## 2024-04-04 ENCOUNTER — Encounter: Payer: Self-pay | Admitting: Surgery

## 2024-04-16 ENCOUNTER — Encounter: Admitting: Physician Assistant

## 2024-04-23 ENCOUNTER — Encounter: Admitting: Surgery

## 2024-05-05 ENCOUNTER — Encounter: Admitting: Surgery

## 2024-05-07 ENCOUNTER — Ambulatory Visit: Admitting: Surgery

## 2024-05-07 ENCOUNTER — Encounter: Payer: Self-pay | Admitting: Surgery

## 2024-05-07 VITALS — BP 131/81 | HR 85 | Temp 98.4°F | Ht 67.0 in | Wt 181.2 lb

## 2024-05-07 DIAGNOSIS — K4090 Unilateral inguinal hernia, without obstruction or gangrene, not specified as recurrent: Secondary | ICD-10-CM

## 2024-05-07 DIAGNOSIS — Z09 Encounter for follow-up examination after completed treatment for conditions other than malignant neoplasm: Secondary | ICD-10-CM

## 2024-05-07 NOTE — Progress Notes (Signed)
 05/07/2024  HPI: Marx Doig is a 29 y.o. male s/p robotic assisted right inguinal hernia repair on 04/02/2024.  Patient presents today for follow-up.  He reports that he is still having some soreness in the right groin particularly towards the scrotum.  He feels an area that is firm.  After surgery, he did feel an area of a popping sensation when he was lifting a trash bag but denies any further signs like that since.  Vital signs: BP 131/81   Pulse 85   Temp 98.4 F (36.9 C) (Oral)   Ht 5' 7 (1.702 m)   Wt 181 lb 3.2 oz (82.2 kg)   SpO2 99%   BMI 28.38 kg/m    Physical Exam: Constitutional: No acute distress Abdomen: Soft, nondistended, with some discomfort to palpation in the lower groin area going into the scrotum.  There is no evidence of recurrent hernia but he does have a good sized seroma that is measuring about 8 cm x 4 cm in the area of his prior hernia sac.  No evidence of infection.  Otherwise his incisions are well-healed.  Assessment/Plan: This is a 29 y.o. male s/p robotic assisted right inguinal hernia repair.  - Discussed with the patient that he has some residual fluid given the large empty space that was created after his hernia repair.  There is fluid can still reabsorb to some degree but he might end up with a seroma once everything heals.  This may be something that we could aspirate in the future but usually we try to leave this alone unless they are causing symptoms. - No evidence of hernia recurrence. - Continue activity restrictions for now. - May follow-up as needed but given return precautions particularly if over the next 4 to 6 weeks the seroma does not decrease in size and is still causing issues, he can let us  know so we can reevaluate him.   Aloysius Sheree Plant, MD [facility]

## 2024-05-07 NOTE — Patient Instructions (Signed)
Please call the office if you have any questions or concerns. 

## 2024-05-19 ENCOUNTER — Ambulatory Visit: Admitting: Cardiovascular Disease

## 2024-06-19 ENCOUNTER — Ambulatory Visit: Admitting: Sleep Medicine
